# Patient Record
Sex: Male | Born: 1935 | Race: Black or African American | Hispanic: No | Marital: Married | Smoking: Former smoker
Health system: Southern US, Community
[De-identification: ages and names within clinical notes are randomized; demographics above are authoritative.]

## PROBLEM LIST (undated history)

## (undated) DIAGNOSIS — F0280 Dementia in other diseases classified elsewhere without behavioral disturbance: Secondary | ICD-10-CM

## (undated) DIAGNOSIS — J40 Bronchitis, not specified as acute or chronic: Secondary | ICD-10-CM

## (undated) DIAGNOSIS — G309 Alzheimer's disease, unspecified: Secondary | ICD-10-CM

## (undated) DIAGNOSIS — H269 Unspecified cataract: Secondary | ICD-10-CM

## (undated) HISTORY — PX: CATARACT EXTRACTION, BILATERAL: SHX1313

## (undated) HISTORY — PX: LUNG REMOVAL, PARTIAL: SHX233

---

## 1990-04-28 HISTORY — PX: PROSTATECTOMY: SHX69

## 1997-11-23 ENCOUNTER — Ambulatory Visit (HOSPITAL_COMMUNITY): Admission: RE | Admit: 1997-11-23 | Discharge: 1997-11-23 | Payer: Self-pay | Admitting: Gastroenterology

## 1998-03-01 ENCOUNTER — Inpatient Hospital Stay (HOSPITAL_COMMUNITY)
Admission: AD | Admit: 1998-03-01 | Discharge: 1998-03-03 | Payer: Self-pay | Admitting: Thoracic Surgery (Cardiothoracic Vascular Surgery)

## 1998-03-01 ENCOUNTER — Encounter: Payer: Self-pay | Admitting: Thoracic Surgery (Cardiothoracic Vascular Surgery)

## 1998-03-02 ENCOUNTER — Encounter: Payer: Self-pay | Admitting: Thoracic Surgery (Cardiothoracic Vascular Surgery)

## 1998-03-03 ENCOUNTER — Encounter: Payer: Self-pay | Admitting: Thoracic Surgery (Cardiothoracic Vascular Surgery)

## 1998-03-26 ENCOUNTER — Encounter: Payer: Self-pay | Admitting: Thoracic Surgery (Cardiothoracic Vascular Surgery)

## 1998-03-27 ENCOUNTER — Inpatient Hospital Stay (HOSPITAL_COMMUNITY)
Admission: RE | Admit: 1998-03-27 | Discharge: 1998-04-04 | Payer: Self-pay | Admitting: Thoracic Surgery (Cardiothoracic Vascular Surgery)

## 1998-03-27 ENCOUNTER — Encounter: Payer: Self-pay | Admitting: Thoracic Surgery (Cardiothoracic Vascular Surgery)

## 1998-03-28 ENCOUNTER — Encounter: Payer: Self-pay | Admitting: Thoracic Surgery (Cardiothoracic Vascular Surgery)

## 1998-03-29 ENCOUNTER — Encounter: Payer: Self-pay | Admitting: Thoracic Surgery (Cardiothoracic Vascular Surgery)

## 1998-03-30 ENCOUNTER — Encounter: Payer: Self-pay | Admitting: Thoracic Surgery (Cardiothoracic Vascular Surgery)

## 1998-03-31 ENCOUNTER — Encounter: Payer: Self-pay | Admitting: Thoracic Surgery (Cardiothoracic Vascular Surgery)

## 1998-04-01 ENCOUNTER — Encounter: Payer: Self-pay | Admitting: Thoracic Surgery (Cardiothoracic Vascular Surgery)

## 1999-04-03 ENCOUNTER — Ambulatory Visit (HOSPITAL_COMMUNITY): Admission: RE | Admit: 1999-04-03 | Discharge: 1999-04-03 | Payer: Self-pay | Admitting: Gastroenterology

## 1999-05-30 ENCOUNTER — Encounter: Payer: Self-pay | Admitting: Emergency Medicine

## 1999-05-30 ENCOUNTER — Encounter: Admission: RE | Admit: 1999-05-30 | Discharge: 1999-05-30 | Payer: Self-pay | Admitting: Emergency Medicine

## 1999-10-08 ENCOUNTER — Encounter: Payer: Self-pay | Admitting: Emergency Medicine

## 1999-10-08 ENCOUNTER — Encounter: Admission: RE | Admit: 1999-10-08 | Discharge: 1999-10-08 | Payer: Self-pay | Admitting: Emergency Medicine

## 2001-10-14 ENCOUNTER — Encounter: Payer: Self-pay | Admitting: Emergency Medicine

## 2001-10-14 ENCOUNTER — Emergency Department (HOSPITAL_COMMUNITY): Admission: EM | Admit: 2001-10-14 | Discharge: 2001-10-14 | Payer: Self-pay | Admitting: Emergency Medicine

## 2002-11-16 ENCOUNTER — Encounter: Admission: RE | Admit: 2002-11-16 | Discharge: 2002-11-16 | Payer: Self-pay | Admitting: Family Medicine

## 2002-11-16 ENCOUNTER — Encounter: Payer: Self-pay | Admitting: Family Medicine

## 2004-02-07 ENCOUNTER — Encounter: Admission: RE | Admit: 2004-02-07 | Discharge: 2004-02-07 | Payer: Self-pay | Admitting: Family Medicine

## 2004-07-08 ENCOUNTER — Ambulatory Visit: Payer: Self-pay | Admitting: Family Medicine

## 2004-07-22 ENCOUNTER — Ambulatory Visit: Payer: Self-pay

## 2006-12-14 DIAGNOSIS — J449 Chronic obstructive pulmonary disease, unspecified: Secondary | ICD-10-CM

## 2006-12-14 DIAGNOSIS — J4489 Other specified chronic obstructive pulmonary disease: Secondary | ICD-10-CM | POA: Insufficient documentation

## 2007-08-17 ENCOUNTER — Encounter: Admission: RE | Admit: 2007-08-17 | Discharge: 2007-08-17 | Payer: Self-pay | Admitting: Emergency Medicine

## 2008-07-14 ENCOUNTER — Emergency Department (HOSPITAL_COMMUNITY): Admission: EM | Admit: 2008-07-14 | Discharge: 2008-07-14 | Payer: Self-pay | Admitting: Family Medicine

## 2008-07-30 ENCOUNTER — Emergency Department (HOSPITAL_COMMUNITY): Admission: EM | Admit: 2008-07-30 | Discharge: 2008-07-30 | Payer: Self-pay | Admitting: Family Medicine

## 2009-11-13 ENCOUNTER — Emergency Department (HOSPITAL_COMMUNITY): Admission: EM | Admit: 2009-11-13 | Discharge: 2009-11-13 | Payer: Self-pay | Admitting: Family Medicine

## 2010-02-15 ENCOUNTER — Emergency Department (HOSPITAL_COMMUNITY): Admission: EM | Admit: 2010-02-15 | Discharge: 2010-02-15 | Payer: Self-pay | Admitting: Emergency Medicine

## 2010-02-15 ENCOUNTER — Emergency Department (HOSPITAL_COMMUNITY): Admission: EM | Admit: 2010-02-15 | Discharge: 2010-02-15 | Payer: Self-pay | Admitting: Family Medicine

## 2010-05-19 ENCOUNTER — Encounter: Payer: Self-pay | Admitting: Family Medicine

## 2010-05-30 ENCOUNTER — Inpatient Hospital Stay (INDEPENDENT_AMBULATORY_CARE_PROVIDER_SITE_OTHER)
Admission: RE | Admit: 2010-05-30 | Discharge: 2010-05-30 | Disposition: A | Discharge status: Home / Self Care | Payer: Federal, State, Local not specified - PPO | Source: Ambulatory Visit | Attending: Family Medicine | Admitting: Family Medicine

## 2010-05-30 DIAGNOSIS — K115 Sialolithiasis: Secondary | ICD-10-CM

## 2010-06-01 ENCOUNTER — Inpatient Hospital Stay (INDEPENDENT_AMBULATORY_CARE_PROVIDER_SITE_OTHER)
Admission: RE | Admit: 2010-06-01 | Discharge: 2010-06-01 | Disposition: A | Discharge status: Home / Self Care | Payer: Federal, State, Local not specified - PPO | Source: Ambulatory Visit

## 2010-06-01 DIAGNOSIS — K115 Sialolithiasis: Secondary | ICD-10-CM

## 2010-07-10 LAB — BASIC METABOLIC PANEL
BUN: 15 mg/dL (ref 6–23)
Calcium: 8.7 mg/dL (ref 8.4–10.5)
Creatinine, Ser: 0.86 mg/dL (ref 0.4–1.5)
GFR calc non Af Amer: >60 mL/min (ref >60)
Potassium: 4.2 mEq/L (ref 3.5–5.1)
Sodium: 137 mEq/L (ref 135–145)

## 2010-09-13 NOTE — Procedures (Signed)
. [facility]  Patient:    Ivan Young                         MRN: [id number] Proc. Date: 04/03/99 Adm. Date:  [id number] Attending:  Charna Elizabeth CC:         Reuben Likes, M.D.                           Procedure Report  DATE OF BIRTH:  05-May-1916  REFERRING PHYSICIAN:  Reuben Likes, M.D.  PROCEDURE PERFORMED:  Colonoscopy.  ENDOSCOPIST: Anselmo Rod, M.D.  INSTRUMENT USED:  Olympus video colonoscope.  INDICATIONS FOR PROCEDURE:  Abnormal barium enema with focal narrowing seen at he junction of the splenic flexure and transverse colon, rule out masses, etc.  PREPROCEDURE PREPARATION:  Informed consent was procured from the patient.  The  patient was fasted for eight hours prior to the procedure and prepped with a bottle of magnesium citrate and a gallon of NuLytely the night prior to the procedure.  PREPROCEDURE PHYSICAL:  The patient had stable vital signs.  Neck supple. Chest clear to auscultation.  S1, S2 regular.  Abdomen soft with normal abdominal bowel sounds.  DESCRIPTION OF PROCEDURE:  The patient was placed in the left lateral decubitus  position and sedated with 30 mg Demerol and 1.5 mg of Versed intravenously. Once the patient was adequately sedated and maintained on low-flow oxygen and continuous cardiac monitoring, the Olympus video colonoscope was advanced from the rectum o the cecum with extreme difficulty secondary to a large amount of residual stool in the colon; however, no masses, narrowing, polyps, etc were noticed.  The prep was poor and therefore, very small lesions may have been missed.  Patient had small  internal hemorrhoids seen on retroflexion and tolerated procedure well without complication.  IMPRESSION: 1. No abnormality seen at the junction of the splenic flexure and the transverse    colon. 2. Small nonbleeding internal hemorrhoid. 3. Very poor prep.  Very small lesions may  have been missed.  RECOMMENDATIONS: 1. Patient has been advised to follow up in the office. 2. Repeat flexible sigmoidoscopy is recommended in the next five years as there    is no history of risk factors in this patient with regard to colorectal cancer. DD:  04/03/99 TD:  04/04/99 Job: [id number]

## 2011-01-06 ENCOUNTER — Other Ambulatory Visit: Payer: Self-pay | Admitting: Family Medicine

## 2011-01-07 ENCOUNTER — Ambulatory Visit
Admission: RE | Admit: 2011-01-07 | Discharge: 2011-01-07 | Disposition: A | Discharge status: Home / Self Care | Payer: Federal, State, Local not specified - PPO | Source: Ambulatory Visit | Attending: Family Medicine | Admitting: Family Medicine

## 2011-03-05 ENCOUNTER — Inpatient Hospital Stay (HOSPITAL_COMMUNITY)
Admission: EM | Admit: 2011-03-05 | Discharge: 2011-03-09 | DRG: 389 | Disposition: A | Discharge status: Home Health | Payer: Medicare Other | Attending: Internal Medicine | Admitting: Internal Medicine

## 2011-03-05 ENCOUNTER — Emergency Department (HOSPITAL_COMMUNITY): Payer: Medicare Other

## 2011-03-05 ENCOUNTER — Other Ambulatory Visit: Payer: Self-pay

## 2011-03-05 DIAGNOSIS — Z902 Acquired absence of lung [part of]: Secondary | ICD-10-CM

## 2011-03-05 DIAGNOSIS — E860 Dehydration: Secondary | ICD-10-CM

## 2011-03-05 DIAGNOSIS — K56609 Unspecified intestinal obstruction, unspecified as to partial versus complete obstruction: Secondary | ICD-10-CM

## 2011-03-05 DIAGNOSIS — F0280 Dementia in other diseases classified elsewhere without behavioral disturbance: Secondary | ICD-10-CM | POA: Diagnosis present

## 2011-03-05 DIAGNOSIS — R112 Nausea with vomiting, unspecified: Secondary | ICD-10-CM | POA: Diagnosis present

## 2011-03-05 DIAGNOSIS — K5900 Constipation, unspecified: Secondary | ICD-10-CM | POA: Diagnosis present

## 2011-03-05 DIAGNOSIS — E873 Alkalosis: Secondary | ICD-10-CM

## 2011-03-05 DIAGNOSIS — N19 Unspecified kidney failure: Secondary | ICD-10-CM

## 2011-03-05 DIAGNOSIS — J449 Chronic obstructive pulmonary disease, unspecified: Secondary | ICD-10-CM

## 2011-03-05 DIAGNOSIS — G309 Alzheimer's disease, unspecified: Secondary | ICD-10-CM | POA: Diagnosis present

## 2011-03-05 DIAGNOSIS — E878 Other disorders of electrolyte and fluid balance, not elsewhere classified: Secondary | ICD-10-CM | POA: Diagnosis present

## 2011-03-05 DIAGNOSIS — E46 Unspecified protein-calorie malnutrition: Secondary | ICD-10-CM | POA: Diagnosis present

## 2011-03-05 DIAGNOSIS — J4489 Other specified chronic obstructive pulmonary disease: Secondary | ICD-10-CM

## 2011-03-05 DIAGNOSIS — N289 Disorder of kidney and ureter, unspecified: Secondary | ICD-10-CM

## 2011-03-05 DIAGNOSIS — K5669 Other intestinal obstruction: Principal | ICD-10-CM | POA: Diagnosis present

## 2011-03-05 DIAGNOSIS — R109 Unspecified abdominal pain: Secondary | ICD-10-CM

## 2011-03-05 HISTORY — DX: Alzheimer's disease, unspecified: G30.9

## 2011-03-05 HISTORY — DX: Alzheimer's disease, unspecified: F02.80

## 2011-03-05 HISTORY — DX: Bronchitis, not specified as acute or chronic: J40

## 2011-03-05 HISTORY — DX: Unspecified cataract: H26.9

## 2011-03-05 LAB — CBC
HCT: 39.9 % (ref 39.0–52.0)
HCT: 37.5 % — ABNORMAL LOW (ref 39.0–52.0)
Hemoglobin: 12.4 g/dL — ABNORMAL LOW (ref 13.0–17.0)
MCH: 30 pg (ref 26.0–34.0)
MCHC: 33.1 g/dL (ref 30.0–36.0)
MCV: 90.7 fL (ref 78.0–100.0)
MCV: 90.6 fL (ref 78.0–100.0)
Platelets: 240 10*3/uL (ref 150–400)
RDW: 15.2 % (ref 11.5–15.5)
RDW: 15.1 % (ref 11.5–15.5)
WBC: 8.3 10*3/uL (ref 4.0–10.5)

## 2011-03-05 LAB — URINE MICROSCOPIC-ADD ON

## 2011-03-05 LAB — COMPREHENSIVE METABOLIC PANEL
ALT: 9 U/L (ref 0–53)
Albumin: 3 g/dL — ABNORMAL LOW (ref 3.5–5.2)
Alkaline Phosphatase: 82 U/L (ref 39–117)
Alkaline Phosphatase: 75 U/L (ref 39–117)
BUN: 79 mg/dL — ABNORMAL HIGH (ref 6–23)
CO2: 36 mEq/L — ABNORMAL HIGH (ref 19–32)
CO2: 35 mEq/L — ABNORMAL HIGH (ref 19–32)
Chloride: 94 mEq/L — ABNORMAL LOW (ref 96–112)
Creatinine, Ser: 3.32 mg/dL — ABNORMAL HIGH (ref 0.50–1.35)
GFR calc Af Amer: 15 mL/min — ABNORMAL LOW (ref >90)
GFR calc Af Amer: 17 mL/min — ABNORMAL LOW (ref >90)
GFR calc non Af Amer: 13 mL/min — ABNORMAL LOW (ref >90)
GFR calc non Af Amer: 15 mL/min — ABNORMAL LOW (ref >90)
Glucose, Bld: 114 mg/dL — ABNORMAL HIGH (ref 70–99)
Glucose, Bld: 93 mg/dL (ref 70–99)
Potassium: 4 mEq/L (ref 3.5–5.1)
Potassium: 3.6 mEq/L (ref 3.5–5.1)
Sodium: 141 mEq/L (ref 135–145)
Total Bilirubin: 0.3 mg/dL (ref 0.3–1.2)
Total Protein: 8 g/dL (ref 6.0–8.3)

## 2011-03-05 LAB — URINALYSIS, ROUTINE W REFLEX MICROSCOPIC
Nitrite: NEGATIVE
pH: 5.5 (ref 5.0–8.0)

## 2011-03-05 LAB — POCT I-STAT TROPONIN I

## 2011-03-05 LAB — MAGNESIUM: Magnesium: 2.7 mg/dL — ABNORMAL HIGH (ref 1.5–2.5)

## 2011-03-05 MED ORDER — DEXTROSE-NACL 5-0.45 % IV SOLN
INTRAVENOUS | Status: DC
  Administered 2011-03-05 – 2011-03-06 (×2): via INTRAVENOUS

## 2011-03-05 MED ORDER — DEXTROSE 5 % IV SOLN
500.0000 mg | Freq: Once | INTRAVENOUS | Status: AC
  Administered 2011-03-05: 500 mg via INTRAVENOUS
  Filled 2011-03-05 (×2): qty 500

## 2011-03-05 MED ORDER — HEPARIN SODIUM (PORCINE) 5000 UNIT/ML IJ SOLN
5000.0000 [IU] | Freq: Three times a day (TID) | INTRAMUSCULAR | Status: DC
  Administered 2011-03-05 – 2011-03-09 (×10): 5000 [IU] via SUBCUTANEOUS
  Filled 2011-03-05 (×15): qty 1

## 2011-03-05 MED ORDER — HEPARIN SODIUM (PORCINE) 5000 UNIT/ML IJ SOLN: 5000.0000 [IU] | Freq: Three times a day (TID) | INTRAMUSCULAR | Status: DC

## 2011-03-05 MED ORDER — SODIUM CHLORIDE 0.9 % IV BOLUS (SEPSIS)
1000.0000 mL | Freq: Once | INTRAVENOUS | Status: AC
  Administered 2011-03-05: 1000 mL via INTRAVENOUS

## 2011-03-05 MED ORDER — ONDANSETRON HCL 4 MG PO TABS: 4.0000 mg | ORAL_TABLET | Freq: Four times a day (QID) | ORAL | Status: DC | PRN

## 2011-03-05 MED ORDER — ONDANSETRON HCL 4 MG/2ML IJ SOLN
4.0000 mg | Freq: Three times a day (TID) | INTRAMUSCULAR | Status: AC | PRN
  Administered 2011-03-05: 4 mg via INTRAVENOUS
  Filled 2011-03-05 (×2): qty 2

## 2011-03-05 MED ORDER — SODIUM CHLORIDE 0.9 % IV SOLN
INTRAVENOUS | Status: AC
  Administered 2011-03-05: 11:00:00 via INTRAVENOUS

## 2011-03-05 MED ORDER — LIDOCAINE HCL 2 % EX GEL
CUTANEOUS | Status: AC
  Filled 2011-03-05: qty 10

## 2011-03-05 MED ORDER — ONDANSETRON HCL 4 MG/2ML IJ SOLN: 4.0000 mg | Freq: Four times a day (QID) | INTRAMUSCULAR | Status: DC | PRN

## 2011-03-05 MED ORDER — FLEET ENEMA 7-19 GM/118ML RE ENEM
1.0000 | ENEMA | Freq: Two times a day (BID) | RECTAL | Status: DC
  Administered 2011-03-05 – 2011-03-09 (×8): 1 via RECTAL
  Filled 2011-03-05 (×8): qty 1

## 2011-03-05 MED ORDER — DEXTROSE 5 % IV SOLN
1.0000 g | INTRAVENOUS | Status: DC
  Administered 2011-03-05: 1 g via INTRAVENOUS
  Filled 2011-03-05 (×2): qty 10

## 2011-03-05 NOTE — ED Notes (Signed)
Pt with hx of Alzheimer's disease presents with c/o flu-like symptoms and n/v x3 days. Per pts wife, he was seen a few days ago, dx with bronchitis and given and rx for Phenergan PR without relief. Pts wife wants him re-evaluated for same.

## 2011-03-05 NOTE — Consults (Signed)
Pt seen and examined.  He has no previous abdominal operations.  CT was reviewed.  I do not see an obvious transition point on my reviewing.  He has a significant colonic stool burden.  Plan per KB's note.

## 2011-03-05 NOTE — ED Provider Notes (Addendum)
NOTE- The clinical picture was not suggestive of pneumonia, which resulted in a delay of the diagnosis of pneumonia at the time of admission.  CT scan with PNA, will tx with IV abx  Small bowel obstruction. Awaiting call from general surgery. NG placed. Being admitted by hospitalist  Lyanne Co, MD 03/05/11 0933  9:48 AM Surgery to evaluate the pt in the ER. Discussed with Caryn Bee, surgical PA  Lyanne Co, MD 03/05/11 [phone]

## 2011-03-05 NOTE — ED Provider Notes (Signed)
History     CSN: [id number] Arrival date & time: 03/05/2011  3:25 AM   First MD Initiated Contact with Patient 03/05/11 0358      Chief Complaint  Patient presents with  . Nausea  . Emesis    (Consider location/radiation/quality/duration/timing/severity/associated sxs/prior treatment) Patient is a 77 y.o. male presenting with vomiting. The history is provided by the patient and the spouse.  Emesis  Pertinent negatives include no abdominal pain, no chills, no fever and no headaches.  pt c/o nausea, decreased appetite for past few days. Has become generally weak. No vomiting or diarrhea. States is moving bowels but unsure when last. No cough or uri c/o. No gu c/o. No fever or chills. Has fallen due to legs giving out/gen weakness. Denies syncope or loc. Denies injury. No headache. No chest pain. No sob. No abd pain. Symptoms constant since onset. Saw pcp w same 2 days ago, was told possible virus.   Past Medical History  Diagnosis Date  . Alzheimer disease     No past surgical history on file.  No family history on file.  History  Substance Use Topics  . Smoking status: Never Smoker   . Smokeless tobacco: Not on file  . Alcohol Use: No      Review of Systems  Constitutional: Negative for fever and chills.  HENT: Negative for neck pain.   Eyes: Negative for pain and visual disturbance.  Respiratory: Negative for shortness of breath.   Cardiovascular: Negative for chest pain and leg swelling.  Gastrointestinal: Positive for vomiting. Negative for abdominal pain.  Genitourinary: Negative for dysuria and flank pain.  Musculoskeletal: Negative for back pain.  Skin: Negative for rash.  Neurological: Negative for headaches.  Hematological: Does not bruise/bleed easily.  Psychiatric/Behavioral: Negative for agitation. The patient is not nervous/anxious.     Allergies  Review of patient's allergies indicates no known allergies.  Home Medications  No current outpatient  prescriptions on file.  BP 93/62  Pulse 113  Temp(Src) 97.5 F (36.4 C) (Oral)  Resp 19  SpO2 85%  Physical Exam  Nursing note and vitals reviewed. Constitutional: No distress.  HENT:  Head: Atraumatic.  Eyes: Pupils are equal, round, and reactive to light.  Neck: Neck supple. No tracheal deviation present.       No stiffness or rigidity  Cardiovascular: Regular rhythm and intact distal pulses.  Exam reveals friction rub. Exam reveals no gallop.   No murmur heard.      tachycardic  Pulmonary/Chest: Effort normal and breath sounds normal. No accessory muscle usage. No respiratory distress. He has no rales.  Abdominal: Soft. He exhibits distension. He exhibits no mass. There is tenderness. There is no rebound and no guarding.  Genitourinary:       No cva tenderness  Musculoskeletal: Normal range of motion. He exhibits no edema and no tenderness.  Neurological: He is alert.       Alert and awake. gen weak, moving bil ext purposefully, follows commands. Speech quiet but clear.   Skin: Skin is warm and dry. No rash noted.  Psychiatric: He has a normal mood and affect.    ED Course  Procedures (including critical care time)  Labs Reviewed - No data to display No results found. Results for orders placed during the hospital encounter of 03/05/11  CBC      Component Value Range   WBC 6.7  4.0 - 10.5 (K/uL)   RBC 4.40  4.22 - 5.81 (MIL/uL)   Hemoglobin 13.2  13.0 - 17.0 (g/dL)   HCT 40.9  81.1 - 91.4 (%)   MCV 90.7  78.0 - 100.0 (fL)   MCH 30.0  26.0 - 34.0 (pg)   MCHC 33.1  30.0 - 36.0 (g/dL)   RDW 78.2  95.6 - 21.3 (%)   Platelets 240  150 - 400 (K/uL)  COMPREHENSIVE METABOLIC PANEL      Component Value Range   Sodium 141  135 - 145 (mEq/L)   Potassium 4.0  3.5 - 5.1 (mEq/L)   Chloride 92 (*) 96 - 112 (mEq/L)   CO2 36 (*) 19 - 32 (mEq/L)   Glucose, Bld 114 (*) 70 - 99 (mg/dL)   BUN 73 (*) 6 - 23 (mg/dL)   Creatinine, Ser 0.86 (*) 0.50 - 1.35 (mg/dL)   Calcium 9.1  8.4  - 10.5 (mg/dL)   Total Protein 8.0  6.0 - 8.3 (g/dL)   Albumin 3.4 (*) 3.5 - 5.2 (g/dL)   AST 23  0 - 37 (U/L)   ALT 9  0 - 53 (U/L)   Alkaline Phosphatase 82  39 - 117 (U/L)   Total Bilirubin 0.7  0.3 - 1.2 (mg/dL)   GFR calc non Af Amer 13 (*) >90 (mL/min)   GFR calc Af Amer 15 (*) >90 (mL/min)  LIPASE, BLOOD      Component Value Range   Lipase 7 (*) 11 - 59 (U/L)  POCT I-STAT TROPONIN I      Component Value Range   Troponin i, poc 0.02  0.00 - 0.08 (ng/mL)   Comment 3            Dg Abd Acute W/chest  03/05/2011  *RADIOLOGY REPORT*  Clinical Data: Upper abdominal pain, nausea, vomiting.  ACUTE ABDOMEN SERIES (ABDOMEN 2 VIEW & CHEST 1 VIEW)  Comparison: 01/08/2004 CT  Findings: Centrolobular and subpleural emphysematous changes. 1.4 cm right upper lung nodule is similar to 2005.  Tortuous aortic arch with atherosclerotic calcification.  The cardiac contour within normal limits.  Dilated loops of small bowel, with air-fluid levels.  Moderate stool burden.  No free intraperitoneal air identified.  Diffuse osteopenia.  Calcific density projecting over the right upper quadrant may represent a gallstone or renal stone.  IMPRESSION: Dilated loops of bowel.  Small bowel obstruction not excluded. There is a moderate stool burden. Consider CT to better characterize.  Original Report Authenticated By: Waneta Martins, M.D.      No diagnosis found.    MDM  Iv ns bolus. Labs.     Date: 03/05/2011  Rate: 107  Rhythm: sinus tachycardia  QRS Axis: right  Intervals: normal  ST/T Wave abnormalities: nonspecific ST/T changes  Conduction Disutrbances:lvh  Narrative Interpretation:   Old EKG Reviewed: changes noted   Recheck abd soft, distended. No reb or guarding. No recurrent emesis in ed. Additional ivf. Pt w dehydration/renal insuff - triad called to admit. Ct r/o sbo ordered and pending. Signed out to morning md, campos, to check ct when back and consult surgery if sbo.     Suzi Roots, MD 03/05/11 0700

## 2011-03-05 NOTE — ED Notes (Signed)
Patient transported to CT 

## 2011-03-05 NOTE — ED Notes (Signed)
Pt resting in bed quietly with eyes closed. Pt's daughter is at bedside. Transport called. Pt awaiting transport to floor. Report called to [place] rn

## 2011-03-05 NOTE — ED Notes (Signed)
Family at bedside. 

## 2011-03-05 NOTE — ED Notes (Signed)
Gastric tubed placed pt tolerated brownish gastric content out. Pt continues to await admit. Pt's family is at bedside

## 2011-03-05 NOTE — H&P (Signed)
Hospital Admission Note Date: 03/05/2011  Patient name: Ivan Young Medical record number: [id number] Date of birth: 12/28/1916 Age: 77 y.o. Gender: male PCP: No primary provider on file.  Chief Complaint: Nausea and vomiting  History of Present Illness: There is a 77 year old male with past medical history of Alzheimer's was not able to give a history but his daughter is at bedside gave most of the history. He has been in usual of health living at home tolerating his normal diet until Friday. He started throwing up. When he saw his primary care doctor given some suppositories for constipation. And told him that if this does not get better he should go to ED. over the weekend he started vomiting. Was not able to tolerate anything by mouth. He started getting dizzy upon standing and actually fell but did not had his head. Chest x-ray was done here in the ED with a CT scan that showed an obstruction. We were asked to admit and further evaluate.  The patient relates no chest pain shortness of breath diarrhea. He does not know when his last bowel movement was   Allergies: Review of patient's allergies indicates no known allergies. Past Medical History  Diagnosis Date  . Alzheimer disease   . Asthma   . Bronchitis   . Cataracts, bilateral    Prior to Admission medications   Medication Sig Start Date End Date Taking? Authorizing Provider  hyoscyamine (LEVSIN, ANASPAZ) 0.125 MG tablet Take 0.125 mg by mouth 3 (three) times daily with meals. Stomach pain    Yes Historical Provider, MD  promethazine (PHENERGAN) 25 MG suppository Place 25 mg rectally every 6 (six) hours as needed. Nausea/vomiting    Yes Historical Provider, MD   Past Surgical History  Procedure Date  . Prostatectomy 1992  . Lung removal, partial   . Cataract extraction, bilateral    History reviewed. No pertinent family history. History   Social History  . Marital Status: Married    Spouse Name: N/A    Number of  Children: N/A  . Years of Education: N/A   Occupational History  . Not on file.   Social History Main Topics  . Smoking status: Former Smoker -- 0.2 packs/day  . Smokeless tobacco: Not on file  . Alcohol Use: 0.6 oz/week    1 Shots of liquor per week  . Drug Use: No  . Sexually Active: No   Other Topics Concern  . Not on file   Social History Narrative  . No narrative on file   Review of Systems: Pertinent items are noted in HPI. Physical Exam: Filed Vitals:   03/05/11 0329 03/05/11 0411 03/05/11 0644  BP: 89/63 93/62 130/66  Pulse: 119 113 105  Temp: 97.7 F (36.5 C) 97.5 F (36.4 C) 97.8 F (36.6 C)  TempSrc: Oral Oral Oral  Resp: 20 19 16   SpO2: 97% 85%     Intake/Output Summary (Last 24 hours) at 03/05/11 1655 Last data filed at 03/05/11 1301  Gross per 24 hour  Intake      0 ml  Output      0 ml  Net      0 ml   BP 130/66  Pulse 105  Temp(Src) 97.8 F (36.6 C) (Oral)  Resp 16  SpO2 85%  General Appearance:    Alert, cooperative, no distress, appears stated age  Head:    Normocephalic, without obvious abnormality, atraumatic  Eyes:    PERRL, conjunctiva/corneas clear, EOM's intact, fundi  benign, both eyes       Ears:    Normal TM's and external ear canals, both ears  Nose:   Nares normal, septum midline, mucosa normal, no drainage    or sinus tenderness  Throat:   Lips, mucosa, and tongue normal; teeth and gums normal  Neck:   Supple, symmetrical, trachea midline, no adenopathy;       thyroid:  No enlargement/tenderness/nodules; no carotid   bruit or JVD  Back:     Symmetric, no curvature, ROM normal, no CVA tenderness  Lungs:     Clear to auscultation bilaterally, respirations unlabored  Chest wall:    No tenderness or deformity  Heart:    Regular rate and rhythm, S1 and S2 normal, no murmur, rub   or gallop  Abdomen:     Soft, non-tender, bowel sounds active all four quadrants,    no masses, no organomegaly  Genitalia:   deferred   Rectal:    deferred  Extremities:   Extremities normal, atraumatic, no cyanosis or edema  Pulses:   2+ and symmetric all extremities  Skin:   Skin color, texture, turgor normal, no rashes or lesions  Lymph nodes:   Cervical, supraclavicular, and axillary nodes normal  Neurologic:   CNII-XII intact. Normal strength, sensation and reflexes      throughout   Lab results:  [facility] 03/05/11 0546  NA 141  K 4.0  CL 92*  CO2 36*  GLUCOSE 114*  BUN 73*  CREATININE 3.67*  CALCIUM 9.1  MG --  PHOS --    Basename 03/05/11 0546  AST 23  ALT 9  ALKPHOS 82  BILITOT 0.7  PROT 8.0  ALBUMIN 3.4*    Basename 03/05/11 0546  LIPASE 7*  AMYLASE --    Basename 03/05/11 0546  WBC 6.7  NEUTROABS --  HGB 13.2  HCT 39.9  MCV 90.7  PLT 240     Imaging results:  Ct Abdomen Pelvis Wo Contrast  03/05/2011  *RADIOLOGY REPORT*  Clinical Data: Pain, vomiting, elevated creatinine  CT ABDOMEN AND PELVIS WITHOUT CONTRAST  Technique:  Multidetector CT imaging of the abdomen and pelvis was performed following the standard protocol without intravenous contrast.  Comparison: None.  Findings: Coarse airspace opacities in the visualized posterior lower lobes, left worse than right.  Moderately advanced emphysematous changes are also noted.  Coronary and aortic calcifications.  There is a 3.9 cm infrarenal fusiform abdominal aortic aneurysm.  Stomach is distended.  There are multiple dilated small bowel loops throughout the abdomen with scattered fluid levels.  The distal most small bowel appears decompressed.  There is moderate fecal material throughout the nondilated colon and rectum.  Urinary bladder is decompressed.  No definite mass or abscess, sensitivity decreased secondary to paucity of intra-abdominal fat and lack of oral   or IV contrast.  Unremarkable uninfused evaluation of the liver, spleen, adrenal glands, left kidney. There is right nephrolithiasis, largest stone in the interpolar region 10 mm; no  hydronephrosis.  Pancreas is not well delineated. Extensive iliofemoral arterial calcifications.  IMPRESSION:  1.  Distal small bowel obstruction, etiology uncertain. 2.  Bibasilar pulmonary air space opacities consistent with pneumonia. 3.  3.9 cm abdominal aortic aneurysm. 4.  Right nephrolithiasis without hydronephrosis.  Original Report Authenticated By: Osa Craver, M.D.   Dg Abd Acute W/chest  03/05/2011  *RADIOLOGY REPORT*  Clinical Data: Upper abdominal pain, nausea, vomiting.  ACUTE ABDOMEN SERIES (ABDOMEN 2 VIEW & CHEST 1 VIEW)  Comparison:  01/08/2004 CT  Findings: Centrolobular and subpleural emphysematous changes. 1.4 cm right upper lung nodule is similar to 2005.  Tortuous aortic arch with atherosclerotic calcification.  The cardiac contour within normal limits.  Dilated loops of small bowel, with air-fluid levels.  Moderate stool burden.  No free intraperitoneal air identified.  Diffuse osteopenia.  Calcific density projecting over the right upper quadrant may represent a gallstone or renal stone.  IMPRESSION: Dilated loops of bowel.  Small bowel obstruction not excluded. There is a moderate stool burden. Consider CT to better characterize.  Original Report Authenticated By: Waneta Martins, M.D.   Other results: EKG: Pending at the time of this    Active Hospital Problem List: 1. Small bowel obstruction. We do not know when the patient had last bowel movement. He has not been tolerating his diet. CT scan shows some bowel obstruction with bowel full of stool. Surgery has been consulted. I agree with suppositories (soap water enemas). Try to decompress the stool. As he has large stool burden in his bowels.repeat KUB in am. Correct electrolytes and avoid narcotics.  2.Acute renal failure,  This probably secondary to decreased intake and vomiting. His last creatinine last month was 0.8.We'll check urinary sodium urinary creatinine continue IV fluids strict I.'s notes. Also check  a basic metabolic panel in the morning.  3. Hypokalemia. is probably secondary to dehydration. We'll give IV fluids and recheck a be met in the morning. Also check a magnesium level.  4. Metabolic alkalosis is probably secondary to vomiting. At this time the patient does not have a gavel continue IV fluids.  5.Protein caloric malnutrition. Will start him and ensure when he is able to take by mouth's.   Rosine Beat 03/05/2011, 4:55 PM

## 2011-03-05 NOTE — ED Notes (Signed)
ZOX:[id number]<AV> Expected date:03/05/11<BR> Expected time: 2:57 AM<BR> Means of arrival:Ambulance<BR> Comments:<BR> FLU LIKE SYMPTOMS. gc m212. elerly male eval at [facility] early in week, family req reeval for same. Vitals stable. Eta 10 min.

## 2011-03-05 NOTE — Consults (Signed)
Reason for Consult:SBO Referring Physician: Patria Mane   HPI: Ivan Young is an 77 y.o. male. He has a hx of early stage Alzheimers and was able to contribute to his history, but primarily his dtr was able to give me the story. He had been in his usual state of health, living at home, tolerating normal diet. His dtr states he has had some trouble with constipation but believes he has been moving his bowels recently. He does not routinely take laxatives or stool softeners. About 4 days ago, he developed onset on abd pain with progressive distension leading to repeated N/V. He has not had fever but has not been able to hold down even water. He has gotten progressively weaker over the past few days and his PCP recommended his famliy bring him to the ED for eval. Upon eval, he was found to be in acute renal failure and significantly distended. X-rays and CT find evidence suggestive of bowel obstruction and possible pneumonia. Medicine has been called to admit the patient for medical management and surgery has been consulted.  Past Medical History  Diagnosis Date  . Alzheimer disease   . Asthma   . Bronchitis   . Cataracts, bilateral     Past Surgical History  Procedure Date  . Prostatectomy 1992  . Lung removal, partial   . Cataract extraction, bilateral   Denies Abdominal surgery or history of hernias.  No family history on file.  Social History:  reports that he has quit smoking. He does not have any smokeless tobacco history on file. He reports that he drinks about .6 ounces of alcohol per week. He reports that he does not use illicit drugs.  Allergies: No Known Allergies  Medications: I have reviewed the patient's current medications.  ROS: See HPI for pertinent findings..  Physical Exam: Blood pressure 130/66, pulse 105, temperature 97.8 F (36.6 C), temperature source Oral, resp. rate 16, SpO2 85.00%. BP 130/66  Pulse 105  Temp(Src) 97.8 F (36.6 C) (Oral)  Resp 16  SpO2  85%  General Appearance:   Awake, slowed mentation, NAD  Head:    Normocephalic, without obvious abnormality, atraumatic  Eyes:    PERRL, conjunctiva/corneas clear, EOM's intact, fundi    benign, both eyes       Ears:    Normal TM's and external ear canals, both ears  Nose:   Nares normal, septum midline, mucosa normal, no drainage   or sinus tenderness. NG in place, about 700cc brown/green bilious output.  Throat:   Lips, mucosa, and tongue normal; teeth and gums normal  Neck:   Supple, symmetrical, trachea midline, no adenopathy;       thyroid:  No enlargement/tenderness/nodules; no carotid   bruit or JVD  Back:     Symmetric, no curvature, ROM normal, no CVA tenderness  Lungs:     Slightly diminished basilar BS, respirations unlabored  Chest wall:    No tenderness or deformity  Heart:    Regular rate and rhythm, S1 and S2 normal, no murmur, rub   or gallop  Abdomen:     Distended but soft, non-tender, no masses or hernias found. No surgical scars. BS hypoactive     Rectal:   Stool in vault  Extremities:   Extremities normal, atraumatic, no cyanosis or edema  Pulses:   Diminished but equal.  Skin:   Skin color, texture, turgor normal, no rashes or lesions  Lymph nodes:   Cervical, supraclavicular, and axillary nodes normal  Labs: CBC  Basename 03/05/11 0546  WBC 6.7  HGB 13.2  HCT 39.9  PLT 240   METABOLIC PANEL  Basename 03/05/11 0546  NA 141  K 4.0  CL 92*  CO2 36*  GLUCOSE 114*  BUN 73*  CREATININE 3.67*  CALCIUM 9.1   PT/INR No results found for this basename: LABPROT:2,INR:2 in the last 72 hours ABG No results found for this basename: PHART:2,PCO2:2,PO2:2,HCO3:2 in the last 72 hours    Ct Abdomen Pelvis Wo Contrast  03/05/2011  *RADIOLOGY REPORT*  Clinical Data: Pain, vomiting, elevated creatinine  CT ABDOMEN AND PELVIS WITHOUT CONTRAST  Technique:  Multidetector CT imaging of the abdomen and pelvis was performed following the standard protocol  without intravenous contrast.  Comparison: None.  Findings: Coarse airspace opacities in the visualized posterior lower lobes, left worse than right.  Moderately advanced emphysematous changes are also noted.  Coronary and aortic calcifications.  There is a 3.9 cm infrarenal fusiform abdominal aortic aneurysm.  Stomach is distended.  There are multiple dilated small bowel loops throughout the abdomen with scattered fluid levels.  The distal most small bowel appears decompressed.  There is moderate fecal material throughout the nondilated colon and rectum.  Urinary bladder is decompressed.  No definite mass or abscess, sensitivity decreased secondary to paucity of intra-abdominal fat and lack of oral   or IV contrast.  Unremarkable uninfused evaluation of the liver, spleen, adrenal glands, left kidney. There is right nephrolithiasis, largest stone in the interpolar region 10 mm; no hydronephrosis.  Pancreas is not well delineated. Extensive iliofemoral arterial calcifications.  IMPRESSION:  1.  Distal small bowel obstruction, etiology uncertain. 2.  Bibasilar pulmonary air space opacities consistent with pneumonia. 3.  3.9 cm abdominal aortic aneurysm. 4.  Right nephrolithiasis without hydronephrosis.  Original Report Authenticated By: Osa Craver, M.D.   Dg Abd Acute W/chest  03/05/2011  *RADIOLOGY REPORT*  Clinical Data: Upper abdominal pain, nausea, vomiting.  ACUTE ABDOMEN SERIES (ABDOMEN 2 VIEW & CHEST 1 VIEW)  Comparison: 01/08/2004 CT  Findings: Centrolobular and subpleural emphysematous changes. 1.4 cm right upper lung nodule is similar to 2005.  Tortuous aortic arch with atherosclerotic calcification.  The cardiac contour within normal limits.  Dilated loops of small bowel, with air-fluid levels.  Moderate stool burden.  No free intraperitoneal air identified.  Diffuse osteopenia.  Calcific density projecting over the right upper quadrant may represent a gallstone or renal stone.  IMPRESSION:  Dilated loops of bowel.  Small bowel obstruction not excluded. There is a moderate stool burden. Consider CT to better characterize.  Original Report Authenticated By: Waneta Martins, M.D.    Assessment: 1.Abd pain and distention-review of the CT reveals this may be more SB ileus vs. SBO secondary to significant stool burden/constipation as his entire colon is filled with stool. No prior hx of abd surgery. No masses or hernias. 2. ARF secondary to dehydration 3.Dementia 4.Possible pneumonia, lower airspace disease.  Plan: Agree with NGT to LIWS. IVF hydration. Abx per primary team for ?PNA. Recommend enemas and/or suppositories to help evacuate stool burden. Follow up x-rays for progress. We will follow. Marianna Fuss 03/05/2011, 12:30 PM

## 2011-03-05 NOTE — ED Notes (Signed)
Pt from home with hx of Alzheimer's. Pt presents with c/o n/v and flu-like symptoms x3 days. Pt was seen here Monday, was prescribed Phenergan PR without relief of sx.

## 2011-03-05 NOTE — ED Notes (Signed)
Pt resting in bed quietly awaiting ct results and admit. Pt family is at bedside

## 2011-03-05 NOTE — ED Notes (Signed)
Pt returned from ct

## 2011-03-05 NOTE — Progress Notes (Signed)
Nursing- pt tol soap suds enema unable to hold all of enema, but held most of it expelled light brown liquid after enema given Gillian Shields Ward

## 2011-03-06 ENCOUNTER — Inpatient Hospital Stay (HOSPITAL_COMMUNITY): Payer: Medicare Other

## 2011-03-06 ENCOUNTER — Other Ambulatory Visit (HOSPITAL_COMMUNITY): Payer: Federal, State, Local not specified - PPO

## 2011-03-06 MED ORDER — SODIUM CHLORIDE 0.9 % IV SOLN
INTRAVENOUS | Status: AC
  Administered 2011-03-06: 14:00:00 via INTRAVENOUS

## 2011-03-06 MED ORDER — POLYETHYLENE GLYCOL 3350 17 G PO PACK
17.0000 g | PACK | Freq: Every day | ORAL | Status: DC
  Administered 2011-03-06 – 2011-03-09 (×4): 17 g via ORAL
  Filled 2011-03-06 (×5): qty 1

## 2011-03-06 NOTE — Progress Notes (Signed)
Subjective: Patient had a large bar movement today. He relates has been having no further nausea Objective: Filed Vitals:   03/05/11 1724 03/05/11 1725 03/05/11 2209 03/06/11 0601  BP: 106/71 106/71 123/69 94/59  Pulse: 92 92 110 101  Temp: 97.9 F (36.6 C) 97.9 F (36.6 C) 100.5 F (38.1 C) 98.9 F (37.2 C)  TempSrc: Oral Oral Oral Oral  Resp: 19 19 18 17   Height: 6\' 1"  (1.854 m) 6\' 1"  (1.854 m)    Weight:  54.432 kg (120 lb)    SpO2: 88% 88% 93% 92%   Weight change:   Intake/Output Summary (Last 24 hours) at 03/06/11 1331 Last data filed at 03/06/11 0300  Gross per 24 hour  Intake    600 ml  Output    400 ml  Net    200 ml    General: Alert, awake, oriented x3, in no acute distress.  HEENT: No bruits, no goiter.  Heart: Regular rate and rhythm, without murmurs, rubs, gallops.  Lungs: Crackles left side, bilateral air movement.  Abdomen: Soft, nontender, nondistended, positive bowel sounds.  Neuro: Grossly intact, nonfocal.   Lab Results:  [facility] 03/05/11 2103 03/05/11 0546  NA 141 141  K 3.6 4.0  CL 94* 92*  CO2 35* 36*  GLUCOSE 93 114*  BUN 79* 73*  CREATININE 3.32* 3.67*  CALCIUM 8.4 9.1  MG -- 2.7*  PHOS -- --    [facility] 03/05/11 2103 03/05/11 0546  AST 29 23  ALT 10 9  ALKPHOS 75 82  BILITOT 0.3 0.7  PROT 7.1 8.0  ALBUMIN 3.0* 3.4*    Basename 03/05/11 0546  LIPASE 7*  AMYLASE --    Basename 03/05/11 2103 03/05/11 0546  WBC 8.3 6.7  NEUTROABS -- --  HGB 12.4* 13.2  HCT 37.5* 39.9  MCV 90.6 90.7  PLT 203 240           Micro Results: No results found for this or any previous visit (from the past 240 hour(s)).  Studies/Results: Ct Abdomen Pelvis Wo Contrast  03/05/2011  *RADIOLOGY REPORT*  Clinical Data: Pain, vomiting, elevated creatinine  CT ABDOMEN AND PELVIS WITHOUT CONTRAST  Technique:  Multidetector CT imaging of the abdomen and pelvis was performed following the standard protocol without intravenous contrast.   Comparison: None.  Findings: Coarse airspace opacities in the visualized posterior lower lobes, left worse than right.  Moderately advanced emphysematous changes are also noted.  Coronary and aortic calcifications.  There is a 3.9 cm infrarenal fusiform abdominal aortic aneurysm.  Stomach is distended.  There are multiple dilated small bowel loops throughout the abdomen with scattered fluid levels.  The distal most small bowel appears decompressed.  There is moderate fecal material throughout the nondilated colon and rectum.  Urinary bladder is decompressed.  No definite mass or abscess, sensitivity decreased secondary to paucity of intra-abdominal fat and lack of oral   or IV contrast.  Unremarkable uninfused evaluation of the liver, spleen, adrenal glands, left kidney. There is right nephrolithiasis, largest stone in the interpolar region 10 mm; no hydronephrosis.  Pancreas is not well delineated. Extensive iliofemoral arterial calcifications.  IMPRESSION:  1.  Distal small bowel obstruction, etiology uncertain. 2.  Bibasilar pulmonary air space opacities consistent with pneumonia. 3.  3.9 cm abdominal aortic aneurysm. 4.  Right nephrolithiasis without hydronephrosis.  Original Report Authenticated By: Osa Craver, M.D.   Dg Abd 2 Views  03/06/2011  *RADIOLOGY REPORT*  Clinical Data: Small bowel obstruction, stool burden  ABDOMEN - 2 VIEW  Comparison: 03/05/2011  Findings: Increase stool in colon. Persistent dilatation of small bowel loops compatible with small bowel obstruction. No definite bowel wall thickening or free intraperitoneal air. Small bowel gas present in the rectum. Scattered atherosclerotic calcifications. Two right renal calculi, largest 8 mm diameter.  IMPRESSION: Persistent dilatation of small bowel loops compatible with small bowel obstruction. Largest loops have slightly increased in size since previous exam. Significant retained stool burden.  Original Report Authenticated By:  Lollie Marrow, M.D.   Dg Abd Acute W/chest  03/05/2011  *RADIOLOGY REPORT*  Clinical Data: Upper abdominal pain, nausea, vomiting.  ACUTE ABDOMEN SERIES (ABDOMEN 2 VIEW & CHEST 1 VIEW)  Comparison: 01/08/2004 CT  Findings: Centrolobular and subpleural emphysematous changes. 1.4 cm right upper lung nodule is similar to 2005.  Tortuous aortic arch with atherosclerotic calcification.  The cardiac contour within normal limits.  Dilated loops of small bowel, with air-fluid levels.  Moderate stool burden.  No free intraperitoneal air identified.  Diffuse osteopenia.  Calcific density projecting over the right upper quadrant may represent a gallstone or renal stone.  IMPRESSION: Dilated loops of bowel.  Small bowel obstruction not excluded. There is a moderate stool burden. Consider CT to better characterize.  Original Report Authenticated By: Waneta Martins, M.D.    Medications: I have reviewed the patient's current medications.   Patient Active Hospital Problem List: 1.Small bowel obstruction (03/05/2011) Agree with surgery to continue the patient n.p.o. will give we'll repeat enema start MiraLAX repeat Abdominal  x-ray in the morning.  2.Renal failure (03/05/2011)  baseline creatinine last year was 0.8. We'll do strict I.'s and O.'s continue IV fluids and check a b-met in the morning.  3.Hypochloremia (03/05/2011)  slightly improved with IV fluids as well as creatinine. We'll change his IV fluids to normal saline. And recheck a basic metabolic panel in the morning.  4.Metabolic alkalosis (03/05/2011)  no diarrhea, will continue IV fluids.  5.Protein calorie malnutrition (03/05/2011)  nutrition consult,and ensure 3 times a day.   LOS: 1 day   FELIZ ORTIZ, Nykerria Macconnell 03/06/2011, 1:31 PM

## 2011-03-06 NOTE — Progress Notes (Signed)
Dr Kaylyn Layer made aware that pt pulled out NG Tube. Orders to leave NG Tube out for now.  Will continue to monitor pt. Ivan Young

## 2011-03-06 NOTE — Progress Notes (Signed)
  Subjective: Pt ok. Good large BM after enemas last pm. Pt pulled NG out sometime last pm, but no nausea or vomiting since. Denies pain.  Objective: Vital signs in last 24 hours: Temp:  [97.9 F (36.6 C)-100.5 F (38.1 C)] 98.9 F (37.2 C) (11/08 0601) Pulse Rate:  [63-110] 101  (11/08 0601) Resp:  [17-19] 17  (11/08 0601) BP: (94-127)/(47-71) 94/59 mmHg (11/08 0601) SpO2:  [88 %-93 %] 92 % (11/08 0601) Weight:  [120 lb (54.432 kg)] 120 lb (54.432 kg) (11/07 1725) Last BM Date: 02/28/11  Intake/Output this shift:    Physical Exam: Abdomen: Softer, less distended. Few BS. No masses, NT  Labs: CBC  Basename 03/05/11 2103 03/05/11 0546  WBC 8.3 6.7  HGB 12.4* 13.2  HCT 37.5* 39.9  PLT 203 240   BMET  Basename 03/05/11 2103 03/05/11 0546  NA 141 141  K 3.6 4.0  CL 94* 92*  CO2 35* 36*  GLUCOSE 93 114*  BUN 79* 73*  CREATININE 3.32* 3.67*  CALCIUM 8.4 9.1   PT/INR No results found for this basename: LABPROT:2,INR:2 in the last 72 hours ABG No results found for this basename: PHART:2,PCO2:2,PO2:2,HCO3:2 in the last 72 hours  Studies/Results: Abd x-ray just completed, no report, but looks like contrast in colon, along with continued stool burden. SB loops still dilated.   Assessment: Principal Problem:  *Small bowel obstruction Active Problems:  Renal failure  Hypochloremia  Metabolic alkalosis  Protein calorie malnutrition  Plan: Would leave NG out, give another enema.  Encourage activity if tolerated/able. If continues to improve, could start some Miralax po later today v. Tomorrow.  LOS: 1 day    Marianna Fuss 03/06/2011

## 2011-03-06 NOTE — Progress Notes (Signed)
CSW met with pt and pt's wife RE: SNF placement. Pt and pt's wife agreeable to STR SNF as b/u plan to home with [facility]. Pt's wife reports she can manage pt at home if he is able to ambulate. CSW to f/u with offers if SNF needed.

## 2011-03-06 NOTE — Progress Notes (Signed)
TALKED TO PATIENT WITH SPOUSE PRESENT ABOUT DCP; PRIMARY CAREGIVER IS THE SPOUSE, PATIENT WITH SOME CONFUSION; SPOUSE IS AGREEABLE TO SHORT TERM SNF PLACEMENT; VM LEFT WITH SOC. WORKER FOR PLACEMENT; B.Kingstin Heims RN, BSN, MHA.

## 2011-03-06 NOTE — Progress Notes (Signed)
Clinically improving.  No indication for surgery at this time.

## 2011-03-06 NOTE — Plan of Care (Signed)
Problem: Phase I Progression Outcomes Goal: Other Phase I Outcomes/Goals Outcome: Progressing Has had large bm after fleets

## 2011-03-06 NOTE — Progress Notes (Signed)
INITIAL ADULT NUTRITION ASSESSMENT Date: 03/06/2011   Time: 3:46 PM Reason for Assessment: MD consult  ASSESSMENT: Male 77 y.o.  Dx: Small bowel obstruction  Hx:  Past Medical History  Diagnosis Date  . Alzheimer disease   . Asthma   . Bronchitis   . Cataracts, bilateral    Related Meds:  Scheduled Meds:   . sodium chloride   Intravenous STAT  . heparin subcutaneous  5,000 Units Subcutaneous Q8H  . lidocaine      . polyethylene glycol  17 g Oral Daily  . sodium phosphate  1 enema Rectal BID  . DISCONTD: cefTRIAXone (ROCEPHIN) IV  1 g Intravenous Q24H  . DISCONTD: heparin  5,000 Units Subcutaneous Q8H   Continuous Infusions:   . sodium chloride    . DISCONTD: dextrose 5 % and 0.45% NaCl 75 mL/hr at 03/06/11 1019   PRN Meds:.ondansetron (ZOFRAN) IV, ondansetron (ZOFRAN) IV, ondansetron  Ht: 6' (182.9 cm)  Wt: 114 lb 3.2 oz (51.801 kg)  Ideal Wt: 80.9kg % Ideal Wt: 64  Usual Wt: 55.9kg % Usual Wt: 93  Body mass index is 15.49 kg/(m^2).  Food/Nutrition Related Hx: Wife reports pt not eating since this past Sunday r/t vomiting and reports pt has lost 9 pounds in the past 6 weeks unintentionally, but some of this may be fluid related. Wife reports pt eating well prior to Sunday, typically eating 2 meals/day r/t having late breakfast and eating early dinner. Wife reports having no idea why pt is so thin because he always eats well and has a great appetite. Wife reports pt has been gradually losing weight as he has gotten older despite eating well. Wife reports pt using Ensure on and off at home. Wife reports pt does not have any problems chewing or swallowing.   Labs:  CMP     Component Value Date/Time   NA 141 03/05/2011 2103   K 3.6 03/05/2011 2103   CL 94* 03/05/2011 2103   CO2 35* 03/05/2011 2103   GLUCOSE 93 03/05/2011 2103   BUN 79* 03/05/2011 2103   CREATININE 3.32* 03/05/2011 2103   CALCIUM 8.4 03/05/2011 2103   PROT 7.1 03/05/2011 2103   ALBUMIN 3.0* 03/05/2011  2103   AST 29 03/05/2011 2103   ALT 10 03/05/2011 2103   ALKPHOS 75 03/05/2011 2103   BILITOT 0.3 03/05/2011 2103   GFRNONAA 15* 03/05/2011 2103   GFRAA 17* 03/05/2011 2103    Intake/Output Summary (Last 24 hours) at 03/06/11 1551 Last data filed at 03/06/11 0300  Gross per 24 hour  Intake    600 ml  Output    400 ml  Net    200 ml    Diet Order: NPO  IVF:    sodium chloride   DISCONTD: dextrose 5 % and 0.45% NaCl Last Rate: 75 mL/hr at 03/06/11 1019    Estimated Nutritional Needs:   Kcal:2100-2350 Protein:95-105g Fluid:2.1-2.3L  NUTRITION DIAGNOSIS: -Inadequate oral intake (NI-2.1).  Status: Ongoing -Pt meets criteria for severe chronic PCM likely r/t advanced age/Alzheimer disease AEB severe muscle and fat loss and BMI of 15.5, suboptimal for age and build.   RELATED TO: small bowel obstruction  AS EVIDENCE BY: CT scan of abdomen/pelvis 11/7 showing distal small bowel obstruction and NPO status  MONITORING/EVALUATION(Goals): Advance diet as tolerated to regular diet.   EDUCATION NEEDS: -No education needs identified at this time  INTERVENTION: Diet advancement per MD. Will monitor.   Dietitian # [id number]  DOCUMENTATION CODES Per approved criteria  -  Severe malnutrition in the context of chronic illness    Marshall Cork 03/06/2011, 3:46 PM

## 2011-03-07 ENCOUNTER — Inpatient Hospital Stay (HOSPITAL_COMMUNITY): Payer: Medicare Other

## 2011-03-07 LAB — BASIC METABOLIC PANEL
BUN: 84 mg/dL — ABNORMAL HIGH (ref 6–23)
CO2: 35 mEq/L — ABNORMAL HIGH (ref 19–32)
Calcium: 8.8 mg/dL (ref 8.4–10.5)
Chloride: 97 mEq/L (ref 96–112)
Creatinine, Ser: 1.81 mg/dL — ABNORMAL HIGH (ref 0.50–1.35)
GFR calc Af Amer: 36 mL/min — ABNORMAL LOW (ref >90)
GFR calc non Af Amer: 31 mL/min — ABNORMAL LOW (ref >90)
Glucose, Bld: 97 mg/dL (ref 70–99)
Potassium: 3.3 mEq/L — ABNORMAL LOW (ref 3.5–5.1)
Sodium: 144 mEq/L (ref 135–145)

## 2011-03-07 NOTE — Progress Notes (Signed)
Subjective: Patient had a large bar movement today. He relates has been having no further nausea. Pt with more strength today. But relates his abdomen slightly distended today. His nontender has had multiple bowel movements Objective: Filed Vitals:   03/06/11 0601 03/06/11 1434 03/06/11 2150 03/07/11 0610  BP: 94/59 97/62 102/65 111/63  Pulse: 101 106 98 96  Temp: 98.9 F (37.2 C) 98.2 F (36.8 C) 98.2 F (36.8 C) 98.3 F (36.8 C)  TempSrc: Oral Oral Oral Oral  Resp: 17 18 18 17   Height:  6' (1.829 m)    Weight:  51.801 kg (114 lb 3.2 oz)    SpO2: 92% 87% 93% 97%   Weight change: -2.631 kg (-5 lb 12.8 oz)  Intake/Output Summary (Last 24 hours) at 03/07/11 0828 Last data filed at 03/07/11 0600  Gross per 24 hour  Intake 1818.75 ml  Output      0 ml  Net 1818.75 ml    General: Alert, awake, oriented x2, in no acute distress.  HEENT: No bruits, no goiter.  Heart: Regular rate and rhythm, without murmurs, rubs, gallops.  Lungs: Crackles left side, bilateral air movement.  Abdomen: Soft, significantly distended, not tender to palpation. And positive bowel sounds. Neuro: Grossly intact, nonfocal.   Lab Results:  [facility] 03/05/11 2103 03/05/11 0546  NA 141 141  K 3.6 4.0  CL 94* 92*  CO2 35* 36*  GLUCOSE 93 114*  BUN 79* 73*  CREATININE 3.32* 3.67*  CALCIUM 8.4 9.1  MG -- 2.7*  PHOS -- --    [facility] 03/05/11 2103 03/05/11 0546  AST 29 23  ALT 10 9  ALKPHOS 75 82  BILITOT 0.3 0.7  PROT 7.1 8.0  ALBUMIN 3.0* 3.4*    Basename 03/05/11 0546  LIPASE 7*  AMYLASE --    Basename 03/05/11 2103 03/05/11 0546  WBC 8.3 6.7  NEUTROABS -- --  HGB 12.4* 13.2  HCT 37.5* 39.9  MCV 90.6 90.7  PLT 203 240    Micro Results: No results found for this or any previous visit (from the past 240 hour(s)).  Studies/Results: Ct Abdomen Pelvis Wo Contrast  03/05/2011  *RADIOLOGY REPORT*  Clinical Data: Pain, vomiting, elevated creatinine  CT ABDOMEN AND PELVIS WITHOUT  CONTRAST IMPRESSION:  1.  Distal small bowel obstruction, etiology uncertain. 2.  Bibasilar pulmonary air space opacities consistent with pneumonia. 3.  3.9 cm abdominal aortic aneurysm. 4.  Right nephrolithiasis without hydronephrosis.  Original Report Authenticated By: Osa Craver, M.D.   Dg Abd 2 Views  03/06/2011  *RADIOLOGY REPORT*  Clinical Data: Small bowel obstruction, stool burden  ABDOMEN - 2 VIEW  Comparison: 03/05/2011  IMPRESSION: Persistent dilatation of small bowel loops compatible with small bowel obstruction. Largest loops have slightly increased in size since previous exam. Significant retained stool burden.  Original Report Authenticated By: Lollie Marrow, M.D.    Medications: I have reviewed the patient's current medications.   Patient Active Hospital Problem List: 1.Small bowel obstruction (03/05/2011)  abdominal x-ray pending this morning. The patient has significant distended abdomen today. Has positive bowel sounds. Has no complaints. Has been tolerating his MiraLAX. And multiple bowel movements yesterday. We'll wait for abdominal x-ray. We'll continue to keep the patient n.p.o.  2.Acute Renal failure (03/05/2011)  patient on IV fluids creatinine continues to improve. Urine sodium pending. Basic metabolic panel for this morning pending. Will continue IV fluids for now. We'll place a condom catheter Foley patient incontinent.  3.Hypochloremia (03/05/2011)  on  basic metabolic panel for today pending. Continue IV fluids.  4.Metabolic alkalosis (03/05/2011)  no diarrhea, basic metabolic panel pending.  5.Protein calorie malnutrition (03/05/2011)  nutrition consult,and ensure 3 times a day.   LOS: 2 days   FELIZ ORTIZ, Lucilia Yanni 03/07/2011, 8:28 AM

## 2011-03-07 NOTE — Progress Notes (Signed)
Pt to d/c home with [facility].  Please re-consult CSW if d/c plan changes to SNF.

## 2011-03-07 NOTE — Progress Notes (Signed)
  Subjective: Denies abd pain. Cannot remember having a bm.  Objective: Vital signs in last 24 hours: Temp:  [98.2 F (36.8 C)-98.3 F (36.8 C)] 98.3 F (36.8 C) 2023/03/26 0610) Pulse Rate:  [96-106] 96  03/26/2023 0610) Resp:  [17-18] 17  Mar 26, 2023 0610) BP: (97-111)/(62-65) 111/63 mmHg 2023/03/26 0610) SpO2:  [87 %-97 %] 97 % 2023/03/26 0610) Weight:  [114 lb 3.2 oz (51.801 kg)] 114 lb 3.2 oz (51.801 kg) (11/08 1434) Last BM Date: 03/06/11  Intake/Output from previous day: 11/08 0701 - 03-26-2023 0700 In: 1818.8 [I.V.:1818.8] Out: -  Intake/Output this shift:    PE:   Lab Results:   Basename 03/05/11 2103 03/05/11 0546  WBC 8.3 6.7  HGB 12.4* 13.2  HCT 37.5* 39.9  PLT 203 240   BMET  Basename 03/05/11 2103 03/05/11 0546  NA 141 141  K 3.6 4.0  CL 94* 92*  CO2 35* 36*  GLUCOSE 93 114*  BUN 79* 73*  CREATININE 3.32* 3.67*  CALCIUM 8.4 9.1   PT/INR No results found for this basename: LABPROT:2,INR:2 in the last 72 hours ABG No results found for this basename: PHART:2,PCO2:2,PO2:2,HCO3:2 in the last 72 hours  Studies/Results: Dg Abd 2 Views  Mar 26, 2011  *RADIOLOGY REPORT*  Clinical Data: Bowel obstruction.  ABDOMEN - 2 VIEW  Comparison: 03/06/2011, 03/05/2011.  Findings: Multiple dilated small bowel loops remain present which may have slightly improved in caliber over the last several days. There remains a large amount of fecal material in the colon which does not appear to have changed significantly.  Decubitus films show no evidence of free intraperitoneal air.  IMPRESSION: Possible mildly improving small bowel obstruction pattern.  No evidence of bowel perforation.  Original Report Authenticated By: Reola Calkins, M.D.   Dg Abd 2 Views  03/06/2011  *RADIOLOGY REPORT*  Clinical Data: Small bowel obstruction, stool burden  ABDOMEN - 2 VIEW  Comparison: 03/05/2011  Findings: Increase stool in colon. Persistent dilatation of small bowel loops compatible with small bowel  obstruction. No definite bowel wall thickening or free intraperitoneal air. Small bowel gas present in the rectum. Scattered atherosclerotic calcifications. Two right renal calculi, largest 8 mm diameter.  IMPRESSION: Persistent dilatation of small bowel loops compatible with small bowel obstruction. Largest loops have slightly increased in size since previous exam. Significant retained stool burden.  Original Report Authenticated By: Lollie Marrow, M.D.    Anti-infectives: Anti-infectives     Start     Dose/Rate Route Frequency Ordered Stop   03/05/11 1000   azithromycin (ZITHROMAX) 500 mg in dextrose 5 % 250 mL IVPB        500 mg 250 mL/hr over 60 Minutes Intravenous  Once 03/05/11 0931 03/05/11 1248   03/05/11 0945   cefTRIAXone (ROCEPHIN) 1 g in dextrose 5 % 50 mL IVPB  Status:  Discontinued        1 g 100 mL/hr over 30 Minutes Intravenous Every 24 hours 03/05/11 0931 03/05/11 1713          Assessment Principal Problem:  *Small bowel obstruction-likely secondary to constipation; slowly improving. Active Problems:  Renal failure  Hypochloremia  Metabolic alkalosis  Protein calorie malnutrition    LOS: 2 days   Plan: continue enemas and could try Miralax, gently.  Ivan Young 03-26-2011

## 2011-03-07 NOTE — Progress Notes (Signed)
Physical Therapy Evaluation Patient Details Name: Ivan Young MRN: [id number] DOB: 06-02-1916 Today's Date: 03/07/2011  Problem List:  Patient Active Problem List  Diagnoses  . COPD  . Renal failure  . Small bowel obstruction  . Hypochloremia  . Metabolic alkalosis  . Protein calorie malnutrition    Past Medical History:  Past Medical History  Diagnosis Date  . Alzheimer disease   . Asthma   . Bronchitis   . Cataracts, bilateral    Past Surgical History:  Past Surgical History  Procedure Date  . Prostatectomy 1992  . Lung removal, partial   . Cataract extraction, bilateral     PT Assessment/Plan/Recommendation PT Assessment Clinical Impression Statement: Pt is 77 y.o. male admitted for SBO, with significant Alzhiemers, impairing mobility and problem solving, but has full spouse support. Pt currently requires Min A for mobility and step-by-step instruction for safety.  PT Recommendation/Assessment: Patient will need skilled PT in the acute care venue PT Problem List: Decreased strength;Decreased activity tolerance;Decreased balance;Decreased mobility;Decreased cognition;Decreased knowledge of use of DME;Decreased safety awareness PT Therapy Diagnosis : Generalized weakness;Difficulty walking PT Plan PT Frequency: Min 3X/week PT Treatment/Interventions: DME instruction;Gait training;Stair training;Functional mobility training;Therapeutic exercise;Balance training;Neuromuscular re-education;Cognitive remediation;Patient/family education PT Recommendation Follow Up Recommendations: Home health PT;24 hour supervision/assistance Equipment Recommended: Rolling walker with 5" wheels PT Goals  Acute Rehab PT Goals PT Goal Formulation: With patient/family Time For Goal Achievement: 7 days Pt will go Supine/Side to Sit: Independently PT Goal: Supine/Side to Sit - Progress: Progressing toward goal Pt will go Sit to Supine/Side: Independently PT Goal: Sit to Supine/Side -  Progress: Progressing toward goal Pt will Transfer Sit to Stand/Stand to Sit: with supervision PT Transfer Goal: Sit to Stand/Stand to Sit - Progress: Progressing toward goal Pt will Ambulate: 51 - 150 feet;with rolling walker PT Goal: Ambulate - Progress: Progressing toward goal Pt will Go Up / Down Stairs: 3-5 stairs;with supervision;with rail(s) PT Goal: Up/Down Stairs - Progress: Progressing toward goal  PT Evaluation Precautions/Restrictions  Precautions Precautions: Fall Restrictions Weight Bearing Restrictions: No Prior Functioning  Home Living Lives With: Spouse Receives Help From: Family Type of Home: House Home Layout: One level;Able to live on main level with bedroom/bathroom Home Access: Stairs to enter Entrance Stairs-Rails: Can reach both Entrance Stairs-Number of Steps: 4 Bathroom Toilet: Standard Home Adaptive Equipment: None Additional Comments: PTA, pt was I with mobility, but wife A with ADLs Prior Function Level of Independence: Independent with gait;Needs assistance with ADLs Able to Take Stairs?: Yes Driving: No Vocation: Retired Financial risk analyst Arousal/Alertness: Awake/alert Overall Cognitive Status: Impaired Orientation Level: Oriented to person;Oriented to place;Other (Comment) (pt forgetfull ) Following Commands: Follows one step commands inconsistently (Requires repeated instruction - simple commands/phrases) Awareness of Deficits: Decreased awareness of deficits (Alzhiemers) Problem Solving: Requires assistance for problem solving Cognition - Other Comments: Requires step-by-step instruction Extremity Assessment RLE Assessment RLE Assessment: Within Functional Limits LLE Assessment LLE Assessment: Within Functional Limits Mobility (including Balance) Bed Mobility Bed Mobility: Yes Supine to Sit: 5: Supervision;HOB flat Supine to Sit Details (indicate cue type and reason): Cues for initiation Transfers Transfers: Yes Sit to Stand: 4:  Min assist;With upper extremity assist Sit to Stand Details (indicate cue type and reason): Cues for hand placement, no carry-over Stand to Sit: 4: Min assist;With upper extremity assist Stand to Sit Details: Cues for hand placement Ambulation/Gait Ambulation/Gait: Yes Ambulation/Gait Assistance: 4: Min assist Ambulation/Gait Assistance Details (indicate cue type and reason): Cues for RW mgt and isntruction on use Ambulation Distance (Feet):  12 Feet (12x2) Assistive device: Rolling walker Gait Pattern: Trunk flexed Stairs: No      End of Session PT - End of Session Equipment Utilized During Treatment: Gait belt Activity Tolerance: Patient tolerated treatment well Patient left: in chair;with call bell in reach;with family/visitor present Nurse Communication: Mobility status for ambulation General Behavior During Session: [facility] for tasks performed Cognition: Impaired, at baseline  [employer] 03/07/2011, 11:04 AM

## 2011-03-08 LAB — OSMOLALITY, URINE: Osmolality, Ur: 572 mOsm/kg (ref 390–1090)

## 2011-03-08 MED ORDER — HYDROCORTISONE 2.5 % RE CREA
TOPICAL_CREAM | Freq: Two times a day (BID) | RECTAL | Status: DC
  Administered 2011-03-08 – 2011-03-09 (×3): via RECTAL
  Filled 2011-03-08: qty 28.35

## 2011-03-08 MED ORDER — SODIUM CHLORIDE 0.9 % IV SOLN: INTRAVENOUS | Status: DC

## 2011-03-08 NOTE — Progress Notes (Signed)
  Subjective: In good spirits.  Bowels moving. Objective: Vital signs in last 24 hours: Temp:  [98 F (36.7 C)-98.6 F (37 C)] 98.6 F (37 C) (11/10 0643) Pulse Rate:  [90-96] 90  (11/10 0643) Resp:  [18-20] 18  (11/10 0643) BP: (106-125)/(68-71) 125/68 mmHg (11/10 0643) SpO2:  [91 %-98 %] 94 % (11/10 0643) Weight:  [116 lb 2.9 oz (52.7 kg)] 116 lb 2.9 oz (52.7 kg) (11/10 0643) Last BM Date: Mar 28, 2011 (after enema)  Intake/Output from previous day: Mar 28, 2023 0701 - 11/10 0700 In: 900 [I.V.:900] Out: 680 [Urine:680] Intake/Output this shift:    PE: Abd- significantly softer and less distended  Lab Results:   [facility] 03/05/11 2103  WBC 8.3  HGB 12.4*  HCT 37.5*  PLT 203   BMET  Basename 03/28/11 0920 03/05/11 2103  NA 144 141  K 3.3* 3.6  CL 97 94*  CO2 35* 35*  GLUCOSE 97 93  BUN 84* 79*  CREATININE 1.81* 3.32*  CALCIUM 8.8 8.4   PT/INR No results found for this basename: LABPROT:2,INR:2 in the last 72 hours ABG No results found for this basename: PHART:2,PCO2:2,PO2:2,HCO3:2 in the last 72 hours  Studies/Results: Dg Abd 2 Views  03-28-11  *RADIOLOGY REPORT*  Clinical Data: Bowel obstruction.  ABDOMEN - 2 VIEW  Comparison: 03/06/2011, 03/05/2011.  Findings: Multiple dilated small bowel loops remain present which may have slightly improved in caliber over the last several days. There remains a large amount of fecal material in the colon which does not appear to have changed significantly.  Decubitus films show no evidence of free intraperitoneal air.  IMPRESSION: Possible mildly improving small bowel obstruction pattern.  No evidence of bowel perforation.  Original Report Authenticated By: Reola Calkins, M.D.   Dg Abd 2 Views  03/06/2011  *RADIOLOGY REPORT*  Clinical Data: Small bowel obstruction, stool burden  ABDOMEN - 2 VIEW  Comparison: 03/05/2011  Findings: Increase stool in colon. Persistent dilatation of small bowel loops compatible with small bowel  obstruction. No definite bowel wall thickening or free intraperitoneal air. Small bowel gas present in the rectum. Scattered atherosclerotic calcifications. Two right renal calculi, largest 8 mm diameter.  IMPRESSION: Persistent dilatation of small bowel loops compatible with small bowel obstruction. Largest loops have slightly increased in size since previous exam. Significant retained stool burden.  Original Report Authenticated By: Lollie Marrow, M.D.    Anti-infectives: Anti-infectives     Start     Dose/Rate Route Frequency Ordered Stop   03/05/11 1000   azithromycin (ZITHROMAX) 500 mg in dextrose 5 % 250 mL IVPB        500 mg 250 mL/hr over 60 Minutes Intravenous  Once 03/05/11 0931 03/05/11 1248   03/05/11 0945   cefTRIAXone (ROCEPHIN) 1 g in dextrose 5 % 50 mL IVPB  Status:  Discontinued        1 g 100 mL/hr over 30 Minutes Intravenous Every 24 hours 03/05/11 0931 03/05/11 1713          Assessment Principal Problem:  * Bowel obstruction-likely secondary to constipation-significant improvement in past 24 hours Active Problems:  Renal failure  Hypochloremia  Metabolic alkalosis  Protein calorie malnutrition    LOS: 3 days   Plan: Agree with starting diet and advancing as tolerated.  Will be available if needed.  Sir Mallis J 03/08/2011

## 2011-03-08 NOTE — Progress Notes (Signed)
Subjective: Multiple bowel movements yesterday with enemas and without enemas. Pt with more strength today. But relates his abdomen distention improved today. Objective: Filed Vitals:   03/07/11 1010 03/07/11 1427 03/07/11 2150 03/08/11 0643  BP:  116/71 106/68 125/68  Pulse: 96 96 96 90  Temp:  98 F (36.7 C) 98.6 F (37 C) 98.6 F (37 C)  TempSrc:  Oral Oral Oral  Resp:  20 18 18   Height:      Weight:    52.7 kg (116 lb 2.9 oz)  SpO2: 91% 98% 94% 94%   Weight change: 0.899 kg (1 lb 15.7 oz)  Intake/Output Summary (Last 24 hours) at 03/08/11 0731 Last data filed at 03/08/11 0646  Gross per 24 hour  Intake    900 ml  Output    680 ml  Net    220 ml    General: Alert, awake, oriented x2, in no acute distress.  HEENT: No bruits, no goiter.  Heart: Regular rate and rhythm, without murmurs, rubs, gallops.  Lungs: Crackles left side, bilateral air movement.  Abdomen: Soft, no distention, not tender to palpation. And positive bowel sounds. Neuro: Grossly intact, nonfocal.   Lab Results:  [facility] 03/07/11 0920 03/05/11 2103  NA 144 141  K 3.3* 3.6  CL 97 94*  CO2 35* 35*  GLUCOSE 97 93  BUN 84* 79*  CREATININE 1.81* 3.32*  CALCIUM 8.8 8.4  MG -- --  PHOS -- --    Basename 03/05/11 2103  AST 29  ALT 10  ALKPHOS 75  BILITOT 0.3  PROT 7.1  ALBUMIN 3.0*   No results found for this basename: LIPASE:2,AMYLASE:2 in the last 72 hours  Basename 03/05/11 2103  WBC 8.3  NEUTROABS --  HGB 12.4*  HCT 37.5*  MCV 90.6  PLT 203    Micro Results: No results found for this or any previous visit (from the past 240 hour(s)).  Studies/Results: Ct Abdomen Pelvis Wo Contrast  03/05/2011  *RADIOLOGY REPORT*  Clinical Data: Pain, vomiting, elevated creatinine  CT ABDOMEN AND PELVIS WITHOUT CONTRAST IMPRESSION:  1.  Distal small bowel obstruction, etiology uncertain. 2.  Bibasilar pulmonary air space opacities consistent with pneumonia. 3.  3.9 cm abdominal aortic aneurysm.  4.  Right nephrolithiasis without hydronephrosis.  Original Report Authenticated By: Osa Craver, M.D.   Dg Abd 2 Views  03/06/2011  *RADIOLOGY REPORT*  Clinical Data: Small bowel obstruction, stool burden  ABDOMEN - 2 VIEW  Comparison: 03/05/2011  IMPRESSION: Persistent dilatation of small bowel loops compatible with small bowel obstruction. Largest loops have slightly increased in size since previous exam. Significant retained stool burden.  Original Report Authenticated By: Lollie Marrow, M.D.    Medications: I have reviewed the patient's current medications.   Patient Active Hospital Problem List: 1.Small bowel obstruction (03/05/2011)  abdominal x-ray from yesterday showed improved dilated bowels. The patient has significant improved distention. Has positive bowel sounds. Has no complaints. Has been tolerating his MiraLAX. And multiple bowel movements yesterday. We'll start him on liquid diet .  2.Acute Renal failure (03/05/2011)  patient on IV fluids creatinine continues to improve. Will continue IV fluids for now at a slower rate. We'll place a condom catheter Foley patient incontinent. Basic metabolic panel in the morning.  3.Hypochloremia (03/05/2011)  resolved.  4.Metabolic alkalosis (03/05/2011)  no diarrhea, basic metabolic panel in the morning.  5.Protein calorie malnutrition (03/05/2011)  nutrition consult,and ensure 3 times a day.   LOS: 3 days  FELIZ ORTIZ, Kenzlie Disch 03/08/2011, 7:31 AM

## 2011-03-09 DIAGNOSIS — K5900 Constipation, unspecified: Secondary | ICD-10-CM | POA: Diagnosis present

## 2011-03-09 LAB — BASIC METABOLIC PANEL
BUN: 39 mg/dL — ABNORMAL HIGH (ref 6–23)
CO2: 34 mEq/L — ABNORMAL HIGH (ref 19–32)
Chloride: 110 mEq/L (ref 96–112)
Creatinine, Ser: 0.92 mg/dL (ref 0.50–1.35)
Glucose, Bld: 94 mg/dL (ref 70–99)

## 2011-03-09 MED ORDER — POLYETHYLENE GLYCOL 3350 17 G PO PACK: 17.0000 g | PACK | Freq: Every day | ORAL | Status: AC

## 2011-03-09 MED ORDER — PHOSPHATE ENEMA 7-19 GM/118ML RE ENEM: 1.0000 | ENEMA | Freq: Once | RECTAL | Status: AC | PRN

## 2011-03-09 MED ORDER — SENNOSIDES-DOCUSATE SODIUM 8.6-50 MG PO TABS: 2.0000 | ORAL_TABLET | Freq: Two times a day (BID) | ORAL | Status: DC

## 2011-03-09 MED ORDER — POTASSIUM CHLORIDE CRYS ER 20 MEQ PO TBCR
40.0000 meq | EXTENDED_RELEASE_TABLET | Freq: Three times a day (TID) | ORAL | Status: DC
  Administered 2011-03-09 (×2): 40 meq via ORAL
  Filled 2011-03-09 (×3): qty 2

## 2011-03-09 MED ORDER — HYDROCORTISONE 2.5 % RE CREA: TOPICAL_CREAM | Freq: Two times a day (BID) | RECTAL | Status: AC

## 2011-03-09 MED ORDER — INFLUENZA VIRUS VACC SPLIT PF IM SUSP
0.5000 mL | INTRAMUSCULAR | Status: AC
  Administered 2011-03-09: 0.5 mL via INTRAMUSCULAR
  Filled 2011-03-09: qty 0.5

## 2011-03-09 MED ORDER — SENNOSIDES-DOCUSATE SODIUM 8.6-50 MG PO TABS
2.0000 | ORAL_TABLET | Freq: Two times a day (BID) | ORAL | Status: DC
  Administered 2011-03-09: 2 via ORAL
  Filled 2011-03-09 (×4): qty 2

## 2011-03-09 NOTE — Discharge Summary (Addendum)
Ivan Young MRN: [id number] DOB/AGE: 05-03-1916 77 y.o.  Admit date: 03/05/2011 Discharge date: 03/09/2011  Primary Care Physician:  No primary provider on file.   Discharge Diagnoses:   Active Hospital Problems  Diagnoses Date Noted   . Obstipation 03/09/2011   . Renal failure 03/05/2011   . Hypochloremia 03/05/2011   . Metabolic alkalosis 03/05/2011   . Protein calorie malnutrition 03/05/2011     Resolved Hospital Problems  Diagnoses Date Noted Date Resolved  . Small bowel obstruction 03/05/2011 03/09/2011     DISCHARGE MEDICATION: Current Discharge Medication List    START taking these medications   Details  hydrocortisone (ANUSOL-HC) 2.5 % rectal cream Place rectally 2 (two) times daily. Qty: 30 g, Refills: 0    polyethylene glycol (MIRALAX / GLYCOLAX) packet Take 17 g by mouth daily. Qty: 14 each, Refills: 0    senna-docusate (SENOKOT-S) 8.6-50 MG per tablet Take 2 tablets by mouth 2 (two) times daily. Qty: 30 tablet, Refills: 0      STOP taking these medications     hyoscyamine (LEVSIN, ANASPAZ) 0.125 MG tablet      promethazine (PHENERGAN) 25 MG suppository             Consults:   [place] [facility] Dr. Kae Heller  SIGNIFICANT DIAGNOSTIC STUDIES:  Ct Abdomen Pelvis Wo Contrast  03/05/2011  *RADIOLOGY REPORT*  Clinical Data: Pain, vomiting, elevated creatinine  CT ABDOMEN AND PELVIS WITHOUT CONTRAST  Technique:  Multidetector CT imaging of the abdomen and pelvis was performed following the standard protocol without intravenous contrast.  Comparison: None.  Findings: Coarse airspace opacities in the visualized posterior lower lobes, left worse than right.  Moderately advanced emphysematous changes are also noted.  Coronary and aortic calcifications.  There is a 3.9 cm infrarenal fusiform abdominal aortic aneurysm.  Stomach is distended.  There are multiple dilated small bowel loops throughout the abdomen with scattered fluid levels.  The distal most small  bowel appears decompressed.  There is moderate fecal material throughout the nondilated colon and rectum.  Urinary bladder is decompressed.  No definite mass or abscess, sensitivity decreased secondary to paucity of intra-abdominal fat and lack of oral   or IV contrast.  Unremarkable uninfused evaluation of the liver, spleen, adrenal glands, left kidney. There is right nephrolithiasis, largest stone in the interpolar region 10 mm; no hydronephrosis.  Pancreas is not well delineated. Extensive iliofemoral arterial calcifications.  IMPRESSION:  1.  Distal small bowel obstruction, etiology uncertain. 2.  Bibasilar pulmonary air space opacities consistent with pneumonia. 3.  3.9 cm abdominal aortic aneurysm. 4.  Right nephrolithiasis without hydronephrosis.  Original Report Authenticated By: Osa Craver, M.D.   Dg Abd 2 Views  03/07/2011  *RADIOLOGY REPORT*  Clinical Data: Bowel obstruction.  ABDOMEN - 2 VIEW  Comparison: 03/06/2011, 03/05/2011.  Findings: Multiple dilated small bowel loops remain present which may have slightly improved in caliber over the last several days. There remains a large amount of fecal material in the colon which does not appear to have changed significantly.  Decubitus films show no evidence of free intraperitoneal air.  IMPRESSION: Possible mildly improving small bowel obstruction pattern.  No evidence of bowel perforation.  Original Report Authenticated By: Reola Calkins, M.D.   Dg Abd 2 Views  03/06/2011  *RADIOLOGY REPORT*  Clinical Data: Small bowel obstruction, stool burden  ABDOMEN - 2 VIEW  Comparison: 03/05/2011  Findings: Increase stool in colon. Persistent dilatation of small bowel loops compatible with small bowel obstruction. No definite  bowel wall thickening or free intraperitoneal air. Small bowel gas present in the rectum. Scattered atherosclerotic calcifications. Two right renal calculi, largest 8 mm diameter.  IMPRESSION: Persistent dilatation of small  bowel loops compatible with small bowel obstruction. Largest loops have slightly increased in size since previous exam. Significant retained stool burden.  Original Report Authenticated By: Lollie Marrow, M.D.   Dg Abd Acute W/chest  03/05/2011  *RADIOLOGY REPORT*  Clinical Data: Upper abdominal pain, nausea, vomiting.  ACUTE ABDOMEN SERIES (ABDOMEN 2 VIEW & CHEST 1 VIEW)  Comparison: 01/08/2004 CT  Findings: Centrolobular and subpleural emphysematous changes. 1.4 cm right upper lung nodule is similar to 2005.  Tortuous aortic arch with atherosclerotic calcification.  The cardiac contour within normal limits.  Dilated loops of small bowel, with air-fluid levels.  Moderate stool burden.  No free intraperitoneal air identified.  Diffuse osteopenia.  Calcific density projecting over the right upper quadrant may represent a gallstone or renal stone.  IMPRESSION: Dilated loops of bowel.  Small bowel obstruction not excluded. There is a moderate stool burden. Consider CT to better characterize.  Original Report Authenticated By: Waneta Martins, M.D.      No results found for this or any previous visit (from the past 240 hour(s)).  BRIEF ADMITTING H & P: There is a 77 year old male with past medical history of Alzheimer's was not able to give a history but his daughter is at bedside gave most of the history. He has been in usual of health living at home tolerating his normal diet until Friday. He started throwing up. When he saw his primary care doctor given some suppositories for constipation. And told him that if this does not get better he should go to ED. over the weekend he started vomiting. Was not able to tolerate anything by mouth. He started getting dizzy upon standing and actually fell but did not had his head. Chest x-ray was done here in the ED with a CT scan that showed an obstruction. We were asked to admit and further evaluate.  The patient relates no chest pain shortness of breath diarrhea.  He does not know when his last bowel movement was.  Hospital Admission Note Date: 03/05/2011   Patient name: Ivan Young            Medical record number: [id number] Date of birth: 01-Nov-1916      Age: 77 y.o.     Gender: male PCP: No primary provider on file.   Chief Complaint: Nausea and vomiting   History of Present Illness: There is a 77 year old male with past medical history of Alzheimer's was not able to give a history but his daughter is at bedside gave most of the history. He has been in usual of health living at home tolerating his normal diet until Friday. He started throwing up. When he saw his primary care doctor given some suppositories for constipation. And told him that if this does not get better he should go to ED. over the weekend he started vomiting. Was not able to tolerate anything by mouth. He started getting dizzy upon standing and actually fell but did not had his head. Chest x-ray was done here in the ED with a CT scan that showed an obstruction. We were asked to admit and further evaluate.  The patient relates no chest pain shortness of breath diarrhea. He does not know when his last bowel movement was     Allergies: Review of patient's allergies indicates no known allergies.  Past Medical History   Diagnosis  Date   .  Alzheimer disease     .  Asthma     .  Bronchitis     .  Cataracts, bilateral      Prior to Admission medications    Medication  Sig  Start Date  End Date  Taking?  Authorizing Provider   hyoscyamine (LEVSIN, ANASPAZ) 0.125 MG tablet  Take 0.125 mg by mouth 3 (three) times daily with meals. Stomach pain       Yes  Historical Provider, MD   promethazine (PHENERGAN) 25 MG suppository  Place 25 mg rectally every 6 (six) hours as needed. Nausea/vomiting       Yes  Historical Provider, MD       Past Surgical History   Procedure  Date   .  Prostatectomy  1992   .  Lung removal, partial     .  Cataract extraction, bilateral      History  reviewed. No pertinent family history. History       Social History   .  Marital Status:  Married       Spouse Name:  N/A       Number of Children:  N/A   .  Years of Education:  N/A       Occupational History   .  Not on file.       Social History Main Topics   .  Smoking status:  Former Smoker -- 0.2 packs/day   .  Smokeless tobacco:  Not on file   .  Alcohol Use:  0.6 oz/week       1 Shots of liquor per week   .  Drug Use:  No   .  Sexually Active:  No       Other Topics  Concern   .  Not on file       Social History Narrative   .  No narrative on file    Review of Systems: Pertinent items are noted in HPI. Physical Exam: Filed Vitals:     03/05/11 0329  03/05/11 0411  03/05/11 0644   BP:  89/63  93/62  130/66   Pulse:  119  113  105   Temp:  97.7 F (36.5 C)  97.5 F (36.4 C)  97.8 F (36.6 C)   TempSrc:  Oral  Oral  Oral   Resp:  20  19  16    SpO2:  97%  85%      Intake/Output Summary (Last 24 hours) at 03/05/11 1655 Last data filed at 03/05/11 1301   Gross per 24 hour   Intake       0 ml   Output       0 ml   Net       0 ml    BP 130/66  Pulse 105  Temp(Src) 97.8 F (36.6 C) (Oral)  Resp 16  SpO2 85%    General Appearance:     Alert, cooperative, no distress, appears stated age   Head:     Normocephalic, without obvious abnormality, atraumatic   Eyes:     PERRL, conjunctiva/corneas clear, EOM's intact, fundi      benign, both eyes        Ears:     Normal TM's and external ear canals, both ears   Nose:    Nares normal, septum midline, mucosa normal, no drainage    or sinus tenderness   Throat:  Lips, mucosa, and tongue normal; teeth and gums normal   Neck:    Supple, symmetrical, trachea midline, no adenopathy;         thyroid:  No enlargement/tenderness/nodules; no carotid   bruit or JVD   Back:      Symmetric, no curvature, ROM normal, no CVA tenderness   Lungs:      Clear to auscultation bilaterally, respirations unlabored   Chest  wall:     No tenderness or deformity   Heart:     Regular rate and rhythm, S1 and S2 normal, no murmur, rub   or gallop   Abdomen:      Soft, non-tender, bowel sounds active all four quadrants,      no masses, no organomegaly   Genitalia:    deferred    Rectal:    deferred   Extremities:    Extremities normal, atraumatic, no cyanosis or edema   Pulses:    2+ and symmetric all extremities   Skin:    Skin color, texture, turgor normal, no rashes or lesions   Lymph nodes:    Cervical, supraclavicular, and axillary nodes normal   Neurologic:    CNII-XII intact. Normal strength, sensation and reflexes        throughout      Lab results: [facility]  03/05/11 0546   NA  141   K  4.0   CL  92*   CO2  36*   GLUCOSE  114*   BUN  73*   CREATININE  3.67*   CALCIUM  9.1   MG  --   PHOS  --    Basename  03/05/11 0546   AST  23   ALT  9   ALKPHOS  82   BILITOT  0.7   PROT  8.0   ALBUMIN  3.4*    Basename  03/05/11 0546   LIPASE  7*   AMYLASE  --    Basename  03/05/11 0546   WBC  6.7   NEUTROABS  --   HGB  13.2   HCT  39.9   MCV  90.7   PLT  240          Active Hospital Problems  Diagnoses Date Noted   . Obstipation: Patient was admitted to the floor, CT scan was done surgery was consulted. They agreed with enemas and stool softeners. He was given several days of enemas.The Levsin was stopped as this may be contributing to the constipation. He had multiple bowel movements. His bili distention decreased. He MiraLAX was added. He continued to have bowel movements. By the next day he was on clear liquid diet which he tolerated well. This wouldn't advance and he tolerated this. So he was discharged in stable condition on MiraLAX and Senokot.  03/09/2011   . Renal failure: This was most likely secondary to dehydration 2 nausea and vomiting did resolve with IV fluids.  03/05/2011   . Hypochloremia: This is probably secondary to dehydration he was given IV fluids does resolve.   03/05/2011   . Metabolic alkalosis: Probably secondary to contraction alkalosis this resolved with IV fluids  03/05/2011   . Protein calorie malnutrition: Nutrition was consulted. And ensure was added he would continue Ensure 3 times a day.  03/05/2011        Decondition: I spoke the the wife about SNF, she relates she would want to take him home as long as       Can get up  from bed. PT was consulted and they recommended a rolling walker which was order      And physical therapy for home. I have also order home health nurse for home.  Resolved Hospital Problems  Diagnoses Date Noted Date Resolved  . Small bowel obstruction: This is probably secondary to obstipation. Please see above.  03/05/2011 03/09/2011     Disposition and Follow-up:  Discharge Orders    Future Orders Please Complete By Expires   Diet - low sodium heart healthy      Increase activity slowly           DISCHARGE EXAM:  Filed Vitals:    03/07/11 1010  03/07/11 1427  03/07/11 2150  03/08/11 0643   BP:   116/71  106/68  125/68   Pulse:  96  96  96  90   Temp:   98 F (36.7 C)  98.6 F (37 C)  98.6 F (37 C)   TempSrc:   Oral  Oral  Oral   Resp:   20  18  18    Height:       Weight:     52.7 kg (116 lb 2.9 oz)   SpO2:  91%  98%  94%  94%    Weight change: 0.899 kg (1 lb 15.7 oz)   Intake/Output Summary (Last 24 hours) at 03/08/11 0731 Last data filed at 03/08/11 0646   Gross per 24 hour   Intake  900 ml   Output  680 ml   Net  220 ml    General: Alert, awake, oriented x2, in no acute distress.  HEENT: No bruits, no goiter.  Heart: Regular rate and rhythm, without murmurs, rubs, gallops.  Lungs: Crackles left side, bilateral air movement.  Abdomen: Soft, no distention, not tender to palpation. And positive bowel sounds.  Neuro: Grossly intact, nonfocal.   Blood pressure 138/83, pulse 79, temperature 97.6 F (36.4 C), temperature source Oral, resp. rate 20, height 6' (1.829 m), weight 52.7 kg (116  lb 2.9 oz), SpO2 92.00%.   Basename 03/07/11 0920  NA 144  K 3.3*  CL 97  CO2 35*  GLUCOSE 97  BUN 84*  CREATININE 1.81*  CALCIUM 8.8  MG --  PHOS --   No results found for this basename: AST:2,ALT:2,ALKPHOS:2,BILITOT:2,PROT:2,ALBUMIN:2 in the last 72 hours No results found for this basename: LIPASE:2,AMYLASE:2 in the last 72 hours No results found for this basename: WBC:2,NEUTROABS:2,HGB:2,HCT:2,MCV:2,PLT:2 in the last 72 hours  Signed: David Stall, Socrates Cahoon 03/09/2011, 8:19 AM

## 2011-03-09 NOTE — Progress Notes (Signed)
CM spoke with pt with wife at bedside. Per pt choice [facility] to provide RW, HHPT. No HHA needed. [facility] notified of referral.

## 2011-03-13 ENCOUNTER — Encounter (HOSPITAL_COMMUNITY): Payer: Self-pay | Admitting: Emergency Medicine

## 2011-10-05 ENCOUNTER — Emergency Department (INDEPENDENT_AMBULATORY_CARE_PROVIDER_SITE_OTHER)
Admission: EM | Admit: 2011-10-05 | Discharge: 2011-10-05 | Disposition: A | Discharge status: Nursing Home (Includes ICF) | Payer: Federal, State, Local not specified - PPO | Source: Home / Self Care | Attending: Family Medicine | Admitting: Family Medicine

## 2011-10-05 ENCOUNTER — Emergency Department (INDEPENDENT_AMBULATORY_CARE_PROVIDER_SITE_OTHER): Payer: Federal, State, Local not specified - PPO

## 2011-10-05 ENCOUNTER — Encounter (HOSPITAL_COMMUNITY): Payer: Self-pay | Admitting: *Deleted

## 2011-10-05 ENCOUNTER — Emergency Department (HOSPITAL_COMMUNITY)
Admission: EM | Admit: 2011-10-05 | Discharge: 2011-10-05 | Disposition: A | Discharge status: Home / Self Care | Payer: Federal, State, Local not specified - PPO | Attending: Emergency Medicine | Admitting: Emergency Medicine

## 2011-10-05 ENCOUNTER — Encounter (HOSPITAL_COMMUNITY): Payer: Self-pay

## 2011-10-05 ENCOUNTER — Emergency Department (HOSPITAL_COMMUNITY): Payer: Federal, State, Local not specified - PPO

## 2011-10-05 DIAGNOSIS — R5381 Other malaise: Secondary | ICD-10-CM

## 2011-10-05 DIAGNOSIS — G309 Alzheimer's disease, unspecified: Secondary | ICD-10-CM | POA: Insufficient documentation

## 2011-10-05 DIAGNOSIS — R911 Solitary pulmonary nodule: Secondary | ICD-10-CM | POA: Insufficient documentation

## 2011-10-05 DIAGNOSIS — F0280 Dementia in other diseases classified elsewhere without behavioral disturbance: Secondary | ICD-10-CM | POA: Insufficient documentation

## 2011-10-05 DIAGNOSIS — J40 Bronchitis, not specified as acute or chronic: Secondary | ICD-10-CM

## 2011-10-05 DIAGNOSIS — J438 Other emphysema: Secondary | ICD-10-CM | POA: Insufficient documentation

## 2011-10-05 DIAGNOSIS — R059 Cough, unspecified: Secondary | ICD-10-CM | POA: Insufficient documentation

## 2011-10-05 DIAGNOSIS — R05 Cough: Secondary | ICD-10-CM

## 2011-10-05 LAB — BASIC METABOLIC PANEL
CO2: 26 mEq/L (ref 19–32)
Calcium: 8.9 mg/dL (ref 8.4–10.5)
Creatinine, Ser: 0.64 mg/dL (ref 0.50–1.35)
GFR calc non Af Amer: 82 mL/min — ABNORMAL LOW (ref >90)
Glucose, Bld: 83 mg/dL (ref 70–99)

## 2011-10-05 LAB — URINALYSIS, ROUTINE W REFLEX MICROSCOPIC
Glucose, UA: NEGATIVE mg/dL
Hgb urine dipstick: NEGATIVE
Ketones, ur: 15 mg/dL — AB
Protein, ur: NEGATIVE mg/dL

## 2011-10-05 LAB — DIFFERENTIAL
Eosinophils Absolute: 0 10*3/uL (ref 0.0–0.7)
Eosinophils Relative: 0 % (ref 0–5)
Lymphocytes Relative: 10 % — ABNORMAL LOW (ref 12–46)
Lymphs Abs: 0.6 10*3/uL — ABNORMAL LOW (ref 0.7–4.0)
Monocytes Absolute: 0.4 10*3/uL (ref 0.1–1.0)

## 2011-10-05 LAB — CBC
HCT: 36.5 % — ABNORMAL LOW (ref 39.0–52.0)
MCH: 30.3 pg (ref 26.0–34.0)
MCV: 92.2 fL (ref 78.0–100.0)
RBC: 3.96 MIL/uL — ABNORMAL LOW (ref 4.22–5.81)
RDW: 14.8 % (ref 11.5–15.5)
WBC: 6 10*3/uL (ref 4.0–10.5)

## 2011-10-05 MED ORDER — AZITHROMYCIN 250 MG PO TABS: 250.0000 mg | ORAL_TABLET | Freq: Every day | ORAL | Status: AC

## 2011-10-05 MED ORDER — IOHEXOL 300 MG/ML  SOLN
80.0000 mL | Freq: Once | INTRAMUSCULAR | Status: AC | PRN
  Administered 2011-10-05: 80 mL via INTRAVENOUS

## 2011-10-05 MED ORDER — HYDROCODONE-ACETAMINOPHEN 7.5-500 MG/15ML PO SOLN: 15.0000 mL | Freq: Four times a day (QID) | ORAL | Status: AC | PRN

## 2011-10-05 MED ORDER — SODIUM CHLORIDE 0.9 % IV BOLUS (SEPSIS): 1000.0000 mL | Freq: Once | INTRAVENOUS | Status: DC

## 2011-10-05 NOTE — ED Notes (Signed)
Patient is unable to void at present. 

## 2011-10-05 NOTE — ED Notes (Addendum)
Spouse reports pt has a productive cough w/tan colored sputum x1 week, pt did not eat anything Monday, yesterday (sat.), or today. Denies N/V/D, fever, chills, chest/abd/back pain, or dizziness.

## 2011-10-05 NOTE — ED Provider Notes (Signed)
History     CSN: [id number]  Arrival date & time 10/05/11  1317   First MD Initiated Contact with Patient 10/05/11 1504      Chief Complaint  Patient presents with  . Cough    (Consider location/radiation/quality/duration/timing/severity/associated sxs/prior treatment) HPI  78 year old male with history of Alzheimer disease, history of asthma, and history of bronchitis presents complaining of cough and generalized weakness. Per previous note from the urgent care Center that the patient was seen today, patient has been having cough with productive green sputum for one week. He has had decreased appetite and has not been eating since yesterday.  He is weaker than usual per wife.  A CXR was performed at [facility] which shows no evidence of pna but does show a nodule density on R upper lobe which may required CT scan for further evaluation.  Per wife, pt has been his usual self up until last week. Pt denies fever, chills, cp, n/v/d, abd pain or urinary sxs.  No discernable weight lost or night sweat per wife.  Hx was limited due to his alzheimer disease.    Past Medical History  Diagnosis Date  . Alzheimer disease   . Asthma   . Bronchitis   . Cataracts, bilateral     Past Surgical History  Procedure Date  . Prostatectomy 1992  . Lung removal, partial   . Cataract extraction, bilateral     History reviewed. No pertinent family history.  History  Substance Use Topics  . Smoking status: Former Smoker -- 0.2 packs/day  . Smokeless tobacco: Not on file  . Alcohol Use: No      Review of Systems  Unable to perform ROS: Dementia    Allergies  Review of patient's allergies indicates no known allergies.  Home Medications   Current Outpatient Rx  Name Route Sig Dispense Refill  . DM-GUAIFENESIN ER 30-600 MG PO TB12 Oral Take 1 tablet by mouth every 12 (twelve) hours.    . DONEPEZIL HCL 10 MG PO TABS Oral Take 10 mg by mouth at bedtime.       BP 160/62  Pulse 79  Temp(Src)  97.6 F (36.4 C) (Oral)  Resp 19  SpO2 94%  Physical Exam  Nursing note and vitals reviewed. Constitutional: He appears well-developed and well-nourished. No distress.  HENT:  Head: Normocephalic and atraumatic.  Mouth/Throat: Oropharynx is clear and moist.       Full dentures in place.  Ear: Mild erythema to TM bilaterally with no overt signs of infection Nose: rhinorrhea Throat: oral mucosa moist and clear, uvula midline  Eyes: Conjunctivae are normal.  Neck: Normal range of motion. Neck supple.  Cardiovascular: Normal rate and regular rhythm.  Exam reveals no gallop and no friction rub.   No murmur heard. Pulmonary/Chest: Effort normal. No respiratory distress. He has no wheezes. He has no rales. He exhibits no tenderness.       Pt cough with each inspirations  Abdominal: Soft. There is no tenderness.  Musculoskeletal: Normal range of motion. He exhibits no edema.       NROM, equal strength bilaterally to all extremities.  Sensation intact to light touch.  No pedal edema  Neurological: He is alert.  Skin: Skin is warm. No rash noted.    ED Course  Procedures (including critical care time)  Labs Reviewed - No data to display Dg Chest 2 View  10/05/2011  *RADIOLOGY REPORT*  Clinical Data: Productive cough  CHEST - 2 VIEW  Comparison: None.  Findings: Cardiomediastinal silhouette is unremarkable. Hyperinflation is noted.  Osteopenia and mild degenerative changes thoracic spine.  There is mild compression deformity mid to lower thoracic spine of indeterminate age.  Clinical correlation is necessary.  There is a nodular density in the right upper lobe perihilar region measures 1.3 cm.  Further evaluation with CT scan of the chest is recommended.  Mild perihilar and infrahilar bronchitic changes.  Probable fibrotic changes or atelectasis left base. No segmental infiltrate or pulmonary edema.  IMPRESSION: Hyperinflation is noted.  Osteopenia and mild degenerative changes thoracic spine.   There is mild compression deformity mid to lower thoracic spine of indeterminate age.  Clinical correlation is necessary.  There is a nodular density in the right upper lobe perihilar region measures 1.3 cm.  Further evaluation with CT scan of the chest is recommended.  Mild perihilar and infrahilar bronchitic changes.  Probable fibrotic changes or atelectasis left base. No segmental infiltrate or pulmonary edema.  Original Report Authenticated By: Natasha Mead, M.D.     No diagnosis found.   Date: 10/05/2011  Rate: 72  Rhythm: sinus arrhythmia  QRS Axis: normal  Intervals: normal  ST/T Wave abnormalities: normal  Conduction Disutrbances:none  Narrative Interpretation:   Old EKG Reviewed: changes noted, normal ECG today compares to nov 7th, 2012  Results for orders placed during the hospital encounter of 10/05/11  CBC      Component Value Range   WBC 6.0  4.0 - 10.5 (K/uL)   RBC 3.96 (*) 4.22 - 5.81 (MIL/uL)   Hemoglobin 12.0 (*) 13.0 - 17.0 (g/dL)   HCT 29.5 (*) 62.1 - 52.0 (%)   MCV 92.2  78.0 - 100.0 (fL)   MCH 30.3  26.0 - 34.0 (pg)   MCHC 32.9  30.0 - 36.0 (g/dL)   RDW 30.8  65.7 - 84.6 (%)   Platelets    150 - 400 (K/uL)   Value: PLATELET CLUMPS NOTED ON SMEAR, COUNT APPEARS DECREASED  DIFFERENTIAL      Component Value Range   Neutrophils Relative 82 (*) 43 - 77 (%)   Neutro Abs 4.9  1.7 - 7.7 (K/uL)   Lymphocytes Relative 10 (*) 12 - 46 (%)   Lymphs Abs 0.6 (*) 0.7 - 4.0 (K/uL)   Monocytes Relative 7  3 - 12 (%)   Monocytes Absolute 0.4  0.1 - 1.0 (K/uL)   Eosinophils Relative 0  0 - 5 (%)   Eosinophils Absolute 0.0  0.0 - 0.7 (K/uL)   Basophils Relative 1  0 - 1 (%)   Basophils Absolute 0.0  0.0 - 0.1 (K/uL)  BASIC METABOLIC PANEL      Component Value Range   Sodium 142  135 - 145 (mEq/L)   Potassium 4.5  3.5 - 5.1 (mEq/L)   Chloride 105  96 - 112 (mEq/L)   CO2 26  19 - 32 (mEq/L)   Glucose, Bld 83  70 - 99 (mg/dL)   BUN 16  6 - 23 (mg/dL)   Creatinine, Ser 9.62   0.50 - 1.35 (mg/dL)   Calcium 8.9  8.4 - 95.2 (mg/dL)   GFR calc non Af Amer 82 (*) >90 (mL/min)   GFR calc Af Amer >90  >90 (mL/min)  URINALYSIS, ROUTINE W REFLEX MICROSCOPIC      Component Value Range   Color, Urine AMBER (*) YELLOW    APPearance CLOUDY (*) CLEAR    Specific Gravity, Urine 1.024  1.005 - 1.030    pH 5.5  5.0 - 8.0    Glucose, UA NEGATIVE  NEGATIVE (mg/dL)   Hgb urine dipstick NEGATIVE  NEGATIVE    Bilirubin Urine SMALL (*) NEGATIVE    Ketones, ur 15 (*) NEGATIVE (mg/dL)   Protein, ur NEGATIVE  NEGATIVE (mg/dL)   Urobilinogen, UA 1.0  0.0 - 1.0 (mg/dL)   Nitrite NEGATIVE  NEGATIVE    Leukocytes, UA TRACE (*) NEGATIVE   URINE MICROSCOPIC-ADD ON      Component Value Range   Squamous Epithelial / LPF RARE  RARE    WBC, UA 0-2  <3 (WBC/hpf)   Urine-Other MUCOUS PRESENT     Dg Chest 2 View  10/05/2011  *RADIOLOGY REPORT*  Clinical Data: Productive cough  CHEST - 2 VIEW  Comparison: None.  Findings: Cardiomediastinal silhouette is unremarkable. Hyperinflation is noted.  Osteopenia and mild degenerative changes thoracic spine.  There is mild compression deformity mid to lower thoracic spine of indeterminate age.  Clinical correlation is necessary.  There is a nodular density in the right upper lobe perihilar region measures 1.3 cm.  Further evaluation with CT scan of the chest is recommended.  Mild perihilar and infrahilar bronchitic changes.  Probable fibrotic changes or atelectasis left base. No segmental infiltrate or pulmonary edema.  IMPRESSION: Hyperinflation is noted.  Osteopenia and mild degenerative changes thoracic spine.  There is mild compression deformity mid to lower thoracic spine of indeterminate age.  Clinical correlation is necessary.  There is a nodular density in the right upper lobe perihilar region measures 1.3 cm.  Further evaluation with CT scan of the chest is recommended.  Mild perihilar and infrahilar bronchitic changes.  Probable fibrotic changes or  atelectasis left base. No segmental infiltrate or pulmonary edema.  Original Report Authenticated By: Natasha Mead, M.D.   Ct Chest W Contrast  10/05/2011  *RADIOLOGY REPORT*  Clinical Data: Chronic cough.  Pulmonary nodule.  History of partial lobectomy, prostatectomy.  CT CHEST WITH CONTRAST  Technique:  Multidetector CT imaging of the chest was performed following the standard protocol during bolus administration of intravenous contrast.  Contrast: 80mL OMNIPAQUE IOHEXOL 300 MG/ML  SOLN  Comparison: Chest radiographs today and abdominal CT 03/05/2011. Report from chest CT 10/08/1999 at [facility] correlated - unavailable for direct comparison.  Findings: No enlarged mediastinal or hilar lymph nodes are identified.  There is mild diffuse aortic ectasia.  There is mild diffuse atherosclerosis of the great vessels, aorta and coronary arteries for the patient's age.  Diffuse emphysematous changes are present throughout both lungs. There is biapical scarring.  Airspace opacities in both lower lobes have improved compared with the abdominal CT from 7 months ago. Corresponding with the radiographic finding is a 1.3 x 1.2 cm right upper lobe nodule.  This has slightly irregular margins, but is largely calcified.  The 2001 CT reported a 1.8 x 1.5 cm right upper lobe pulmonary nodule.  No other pulmonary nodules are seen.  Images through the upper abdomen again demonstrate a right renal calculus.  There is a mild anterior wedge deformity at T10 without definite acute features.  No acute fractures are identified.  IMPRESSION:  1.  Right upper lobe pulmonary nodule is partially calcified. Although slightly irregular in contour, this is favored to reflect a chronic granuloma or hamartoma, especially since an old CT from 2001 reported a similar finding.  Scar carcinoma can developed in a granuloma and radiographic followup is suggested for this 78 year old. 2.  Diffuse emphysema with improvement in the air space  opacities demonstrated on the prior abdominal CT. 3.  Scattered atherosclerosis. 4.  T10 compression deformity, likely nonacute.  Original Report Authenticated By: Gerrianne Scale, M.D.    1. Cough/bronchitis 2. Pulmonary nodule seen on CT image  MDM  Productive cough x 1 week.  No hemoptysis.  Afebrile.  CXR shows pulmonary nodule, will CT scan here for further eval.  Will check H&H and electrolytes panel, and UA.  IVF given. ECG shows no acute changes.  Discussed with my attending.    5:58 PM Patient has no evidence of anemia. His electrolytes all within normal limits. A chest CT was performed which shows no acute finding. There is a right upper lobe pulmonary nodule is partially calcified. This was thought to reflect a chronic granuloma or hamartoma.  Evidence of emphysema without evidence of infection. I discussed this with my attending who will reassure patient. Patient is stable to be discharged.      Fayrene Helper, PA-C 10/05/11 1805

## 2011-10-05 NOTE — ED Notes (Signed)
Pt has productive cough that started one week ago and observed large amount of green sputum while examining pt.  He has also gone for two days this week without eating.  Wife states he was very weak this am.

## 2011-10-05 NOTE — ED Provider Notes (Signed)
History     CSN: [id number]  Arrival date & time 10/05/11  1127   First MD Initiated Contact with Patient 10/05/11 1135      Chief Complaint  Patient presents with  . Cough    (Consider location/radiation/quality/duration/timing/severity/associated sxs/prior treatment) HPI Comments:  78 year old male with history of asthma bronchitis and Alzheimer's. Former smoker. Here with wife complaining of increased coughing from what is base line for him, associated with green sputum during the last few days and generalized weakness during the last week. Symptoms associated with decrease activity level. Also has not been eating solids and minimal fluid intake since yesterday. Today has not had any drinks or meals. Patient states he "feels hurting all over for few weeks". Last bowel movement with soft stools a early this morning as per wife with no blood. No hematuria.    Past Medical History  Diagnosis Date  . Alzheimer disease   . Asthma   . Bronchitis   . Cataracts, bilateral     Past Surgical History  Procedure Date  . Prostatectomy 1992  . Lung removal, partial   . Cataract extraction, bilateral     History reviewed. No pertinent family history.  History  Substance Use Topics  . Smoking status: Former Smoker -- 0.2 packs/day  . Smokeless tobacco: Not on file  . Alcohol Use: No      Review of Systems  Constitutional: Positive for activity change, appetite change and fatigue. Negative for diaphoresis.       Unknown if fever at home. Wife has not been able to check temperature.  HENT: Positive for postnasal drip. Negative for sore throat, mouth sores, trouble swallowing and voice change.   Eyes: Negative for discharge.  Respiratory: Positive for cough. Negative for choking, shortness of breath and wheezing.   Gastrointestinal: Negative for vomiting, abdominal pain, diarrhea and blood in stool.       No solid intake for 2 days. No liquid or solid intake today.  Genitourinary:  Negative for frequency and hematuria.  Musculoskeletal:       States he "hurts all over"  Skin: Negative for rash.  Neurological: Negative for seizures.  Psychiatric/Behavioral:       Decrease activity level. Spending most time in bed. Did not wanted to go to church today, feels very weak.    Allergies  Review of patient's allergies indicates no known allergies.  Home Medications   Current Outpatient Rx  Name Route Sig Dispense Refill  . DM-GUAIFENESIN ER 30-600 MG PO TB12 Oral Take 1 tablet by mouth every 12 (twelve) hours.    . DONEPEZIL HCL 10 MG PO TABS Oral Take 10 mg by mouth at bedtime as needed.    Bernadette Hoit SODIUM 8.6-50 MG PO TABS Oral Take 2 tablets by mouth 2 (two) times daily. 30 tablet 0    BP 127/73  Pulse 87  Temp(Src) 99 F (37.2 C) (Oral)  Resp 24  SpO2 94%  Physical Exam  Nursing note and vitals reviewed. Constitutional: He appears well-developed. No distress.       Very thin.  HENT:  Head: Normocephalic and atraumatic.  Right Ear: External ear normal.  Left Ear: External ear normal.  Nose: Nose normal.  Mouth/Throat: Oropharynx is clear and moist. No oropharyngeal exudate.       Moist mucous membranes. Patient wears dentures. Oropharynx is clear with no exudates. There is clear postnasal drainage. Right TM some dullness at and retraction no erythema or swelling. Left TM normal.  Eyes: Conjunctivae and EOM are normal. Pupils are equal, round, and reactive to light. Right eye exhibits no discharge. Left eye exhibits no discharge. No scleral icterus.  Neck: Neck supple. No JVD present.  Cardiovascular: Normal rate and regular rhythm.   Pulmonary/Chest:       Impress decrease breath sounds bilaterally. Poor effort. Mild tachypnea with subtle supraclavicular retractions. No wheezing rhonchi or crackles.  Abdominal: Soft. He exhibits no distension and no mass. There is no tenderness.  Lymphadenopathy:    He has no cervical adenopathy.    Neurological: He is alert.  Skin: Skin is warm. No rash noted. He is not diaphoretic.    ED Course  Procedures (including critical care time)  Labs Reviewed - No data to display Dg Chest 2 View  10/05/2011  *RADIOLOGY REPORT*  Clinical Data: Productive cough  CHEST - 2 VIEW  Comparison: None.  Findings: Cardiomediastinal silhouette is unremarkable. Hyperinflation is noted.  Osteopenia and mild degenerative changes thoracic spine.  There is mild compression deformity mid to lower thoracic spine of indeterminate age.  Clinical correlation is necessary.  There is a nodular density in the right upper lobe perihilar region measures 1.3 cm.  Further evaluation with CT scan of the chest is recommended.  Mild perihilar and infrahilar bronchitic changes.  Probable fibrotic changes or atelectasis left base. No segmental infiltrate or pulmonary edema.  IMPRESSION: Hyperinflation is noted.  Osteopenia and mild degenerative changes thoracic spine.  There is mild compression deformity mid to lower thoracic spine of indeterminate age.  Clinical correlation is necessary.  There is a nodular density in the right upper lobe perihilar region measures 1.3 cm.  Further evaluation with CT scan of the chest is recommended.  Mild perihilar and infrahilar bronchitic changes.  Probable fibrotic changes or atelectasis left base. No segmental infiltrate or pulmonary edema.  Original Report Authenticated By: Natasha Mead, M.D.     1. Bronchitis   2. Physical deconditioning       MDM  EKG: Sinus rhythm with ventricular rate 72 no ST or acute ischemic changes. No blocks.  Impress bronchitis but concerned about deconditioning also patient not eating or drinking today at risk for dehydration.  Decided to transfer to the emergency department for further evaluation and management. No blood test has been performed at [facility] . Condition at discharge: Stable          Sharin Grave, MD 10/06/11 1154

## 2011-10-05 NOTE — ED Notes (Signed)
EKG performed; presented to Dr. Alfonse Ras at pt bedside; no previous tracing requested by MD at this time.

## 2011-10-05 NOTE — ED Notes (Signed)
[facility] for productive cough x 1 week, having generalized weakness, not eating. Had xray done at [facility] and it showed right lung density and sent here for ct scan.

## 2011-10-05 NOTE — Discharge Instructions (Signed)
You were found to have a pulmonary nodule in your lung on chest CT.  This is likely a chronic finding.  You may have a follow up CT as per recommendation from your doctor if needed.  Take cough medication as prescribed.  Return if you have any other concerns.    Cough, Adult  A cough is a reflex. It helps you clear your throat and airways. A cough can help heal your body. A cough can last 2 or 3 weeks (acute) or may last more than 8 weeks (chronic). Some common causes of a cough can include an infection, allergy, or a cold. HOME CARE  Only take medicine as told by your doctor.   If given, take your medicines (antibiotics) as told. Finish them even if you start to feel better.   Use a cold steam vaporizer or humidier in your home. This can help loosen thick spit (secretions).   Sleep so you are almost sitting up (semi-upright). Use pillows to do this. This helps reduce coughing.   Rest as needed.   Stop smoking if you smoke.  GET HELP RIGHT AWAY IF:  You have yellowish-white fluid (pus) in your thick spit.   Your cough gets worse.   Your medicine does not reduce coughing, and you are losing sleep.   You cough up blood.   You have trouble breathing.   Your pain gets worse and medicine does not help.   You have a fever.  MAKE SURE YOU:   Understand these instructions.   Will watch your condition.   Will get help right away if you are not doing well or get worse.  Document Released: 12/26/2010 Document Revised: 04/03/2011 Document Reviewed: 12/26/2010 [facility] Patient Information 2012 [place], [place].

## 2011-10-05 NOTE — ED Notes (Signed)
Patient denies pain and is resting comfortably.  

## 2011-10-07 NOTE — ED Provider Notes (Signed)
Medical screening examination/treatment/procedure(s) were conducted as a shared visit with non-physician practitioner(s) and myself.  I personally evaluated the patient during the encounter 77 yo man with cough, COPD seen on x-ray had pulmonary nodule that on CT appeared benign and was like one seen on CT in 2001.  Reassured and released on symptomatic treatment.  Carleene Cooper III, MD 10/07/11 [phone]

## 2012-06-30 ENCOUNTER — Encounter (HOSPITAL_COMMUNITY): Payer: Self-pay | Admitting: *Deleted

## 2012-06-30 ENCOUNTER — Emergency Department (HOSPITAL_COMMUNITY): Payer: Medicare Other

## 2012-06-30 ENCOUNTER — Emergency Department (HOSPITAL_COMMUNITY)
Admission: EM | Admit: 2012-06-30 | Discharge: 2012-07-01 | Disposition: A | Discharge status: Home / Self Care | Payer: Medicare Other | Attending: Emergency Medicine | Admitting: Emergency Medicine

## 2012-06-30 DIAGNOSIS — S0190XA Unspecified open wound of unspecified part of head, initial encounter: Secondary | ICD-10-CM | POA: Insufficient documentation

## 2012-06-30 DIAGNOSIS — Y939 Activity, unspecified: Secondary | ICD-10-CM | POA: Insufficient documentation

## 2012-06-30 DIAGNOSIS — Y92009 Unspecified place in unspecified non-institutional (private) residence as the place of occurrence of the external cause: Secondary | ICD-10-CM | POA: Insufficient documentation

## 2012-06-30 DIAGNOSIS — Y9289 Other specified places as the place of occurrence of the external cause: Secondary | ICD-10-CM | POA: Insufficient documentation

## 2012-06-30 DIAGNOSIS — Z79899 Other long term (current) drug therapy: Secondary | ICD-10-CM | POA: Insufficient documentation

## 2012-06-30 DIAGNOSIS — J209 Acute bronchitis, unspecified: Secondary | ICD-10-CM | POA: Insufficient documentation

## 2012-06-30 DIAGNOSIS — R059 Cough, unspecified: Secondary | ICD-10-CM | POA: Insufficient documentation

## 2012-06-30 DIAGNOSIS — W1809XA Striking against other object with subsequent fall, initial encounter: Secondary | ICD-10-CM | POA: Insufficient documentation

## 2012-06-30 DIAGNOSIS — Z87891 Personal history of nicotine dependence: Secondary | ICD-10-CM | POA: Insufficient documentation

## 2012-06-30 DIAGNOSIS — G309 Alzheimer's disease, unspecified: Secondary | ICD-10-CM | POA: Insufficient documentation

## 2012-06-30 DIAGNOSIS — Z8669 Personal history of other diseases of the nervous system and sense organs: Secondary | ICD-10-CM | POA: Insufficient documentation

## 2012-06-30 DIAGNOSIS — R05 Cough: Secondary | ICD-10-CM | POA: Insufficient documentation

## 2012-06-30 DIAGNOSIS — IMO0002 Reserved for concepts with insufficient information to code with codable children: Secondary | ICD-10-CM | POA: Insufficient documentation

## 2012-06-30 DIAGNOSIS — F0280 Dementia in other diseases classified elsewhere without behavioral disturbance: Secondary | ICD-10-CM | POA: Insufficient documentation

## 2012-06-30 DIAGNOSIS — J40 Bronchitis, not specified as acute or chronic: Secondary | ICD-10-CM

## 2012-06-30 DIAGNOSIS — S0191XA Laceration without foreign body of unspecified part of head, initial encounter: Secondary | ICD-10-CM

## 2012-06-30 DIAGNOSIS — W19XXXA Unspecified fall, initial encounter: Secondary | ICD-10-CM

## 2012-06-30 DIAGNOSIS — J45909 Unspecified asthma, uncomplicated: Secondary | ICD-10-CM | POA: Insufficient documentation

## 2012-06-30 DIAGNOSIS — R509 Fever, unspecified: Secondary | ICD-10-CM

## 2012-06-30 HISTORY — DX: Alzheimer's disease, unspecified: G30.9

## 2012-06-30 HISTORY — DX: Dementia in other diseases classified elsewhere, unspecified severity, without behavioral disturbance, psychotic disturbance, mood disturbance, and anxiety: F02.80

## 2012-06-30 LAB — BASIC METABOLIC PANEL
CO2: 25 mEq/L (ref 19–32)
Calcium: 8.6 mg/dL (ref 8.4–10.5)
Glucose, Bld: 110 mg/dL — ABNORMAL HIGH (ref 70–99)
Sodium: 137 mEq/L (ref 135–145)

## 2012-06-30 LAB — CBC WITH DIFFERENTIAL/PLATELET
Eosinophils Relative: 0 % (ref 0–5)
HCT: 36.7 % — ABNORMAL LOW (ref 39.0–52.0)
Hemoglobin: 12.4 g/dL — ABNORMAL LOW (ref 13.0–17.0)
Lymphocytes Relative: 5 % — ABNORMAL LOW (ref 12–46)
Lymphs Abs: 0.8 10*3/uL (ref 0.7–4.0)
MCV: 90.4 fL (ref 78.0–100.0)
Monocytes Absolute: 0.8 10*3/uL (ref 0.1–1.0)
Monocytes Relative: 5 % (ref 3–12)
Platelets: 207 10*3/uL (ref 150–400)
RBC: 4.06 MIL/uL — ABNORMAL LOW (ref 4.22–5.81)
WBC: 16.3 10*3/uL — ABNORMAL HIGH (ref 4.0–10.5)

## 2012-06-30 LAB — POCT I-STAT TROPONIN I

## 2012-06-30 MED ORDER — AZITHROMYCIN 250 MG PO TABS
500.0000 mg | ORAL_TABLET | Freq: Once | ORAL | Status: AC
  Administered 2012-07-01: 500 mg via ORAL
  Filled 2012-06-30: qty 2

## 2012-06-30 NOTE — ED Notes (Signed)
Pt to radiology at this time.

## 2012-06-30 NOTE — ED Notes (Signed)
Per family pt fell around 1830 hitting back of head; abrasion noted behind right ear; pt found by family on floor in bathroom; hit mouth; neg loc; unsure what made patient fall initially; per family states pt has history of falls; went to bed and woke up c/o chest and back pain

## 2012-06-30 NOTE — ED Notes (Signed)
Pt poor historian r/t hc of Alzheimer's disease.  Pt unable to answer many questions other than stating pain to chest.  Per pt's wife, pt fell at 1800 and hit the back of his head and his mouth. Bleeding controlled to 2cm lac behind right ear.  Pt went to sleep at 1830 after taking Aleve for pain.  Pt awoke at 2200 c/o chest and back pain.

## 2012-06-30 NOTE — ED Provider Notes (Addendum)
History     CSN: [id number]  Arrival date & time 06/30/12  2200   First MD Initiated Contact with Patient 06/30/12 2302      Chief Complaint  Patient presents with  . Fall  . Chest Pain    (Consider location/radiation/quality/duration/timing/severity/associated sxs/prior treatment) HPI 79 year old male presents emergency department from home with complaint of fall earlier in the evening, and chest back pain and head pain. Patient has dementia and is not a reliable historian. Wife reports he fell in the bathroom around 6 PM. He has abrasion to scalp behind right ear. Patient did not appear to have LOC, but wife reports fall was unwitnessed. Patient went to bed soon after the fall. He woke up just prior to arrival complaining of pain. Patient has chronic asthma and bronchitis with cough. He has been placed on Mucinex as primary care Dr. Denton Lank reports he's had a normal day, aside from having decreased appetite. Patient did have low-grade fever here today, wife denies any fevers prior to arrival. She reports his cough is no worse than normal. There's been no complaint of abdominal pain. She denies any foul smell to his urine.   Past Medical History  Diagnosis Date  . Alzheimer disease   . Asthma   . Bronchitis   . Cataracts, bilateral   . Alzheimer's dementia     Past Surgical History  Procedure Laterality Date  . Prostatectomy  1992  . Lung removal, partial    . Cataract extraction, bilateral      No family history on file.  History  Substance Use Topics  . Smoking status: Former Smoker -- 0.25 packs/day  . Smokeless tobacco: Not on file  . Alcohol Use: No      Review of Systems  Unable to perform ROS: Dementia    Allergies  Review of patient's allergies indicates no known allergies.  Home Medications   Current Outpatient Rx  Name  Route  Sig  Dispense  Refill  . dextromethorphan-guaiFENesin (MUCINEX DM) 30-600 MG per 12 hr tablet   Oral   Take 1 tablet by mouth  every 12 (twelve) hours.         Marland Kitchen donepezil (ARICEPT) 10 MG tablet   Oral   Take 10 mg by mouth at bedtime.            BP 130/74  Pulse 97  Temp(Src) 100.4 F (38 C) (Oral)  Resp 18  SpO2 100%  Physical Exam  Nursing note and vitals reviewed. Constitutional: He appears well-developed and well-nourished. No distress.  HENT:  Head: Normocephalic.  Right Ear: External ear normal.  Left Ear: External ear normal.  Nose: Nose normal.  Mouth/Throat: Oropharynx is clear and moist. No oropharyngeal exudate.  Abrasion versus laceration behind right ear.  Eyes: Conjunctivae and EOM are normal. Pupils are equal, round, and reactive to light.  Neck: Normal range of motion. Neck supple. No JVD present. No tracheal deviation present. No thyromegaly present.  Cardiovascular: Normal rate, regular rhythm, normal heart sounds and intact distal pulses.  Exam reveals no gallop and no friction rub.   No murmur heard. Pulmonary/Chest: No stridor. He has no wheezes. He has no rales. He exhibits no tenderness.  Coarse cough  Abdominal: Soft. Bowel sounds are normal. He exhibits no distension and no mass. There is no tenderness. There is no rebound and no guarding.  Musculoskeletal: Normal range of motion. He exhibits no edema and no tenderness.  Lymphadenopathy:    He has no cervical  adenopathy.  Neurological: He is alert. He exhibits normal muscle tone. Coordination normal.  Skin: Skin is warm and dry. No rash noted. He is not diaphoretic. No erythema. No pallor.    ED Course  Procedures (including critical care time)  Labs Reviewed  CBC WITH DIFFERENTIAL - Abnormal; Notable for the following:    WBC 16.3 (*)    RBC 4.06 (*)    Hemoglobin 12.4 (*)    HCT 36.7 (*)    Neutrophils Relative 91 (*)    Neutro Abs 14.8 (*)    Lymphocytes Relative 5 (*)    All other components within normal limits  BASIC METABOLIC PANEL - Abnormal; Notable for the following:    Glucose, Bld 110 (*)    GFR  calc non Af Amer 71 (*)    GFR calc Af Amer 82 (*)    All other components within normal limits  URINALYSIS, ROUTINE W REFLEX MICROSCOPIC  POCT I-STAT TROPONIN I   Dg Chest 2 View  06/30/2012  *RADIOLOGY REPORT*  Clinical Data: Fall of right chest pain.  CHEST - 2 VIEW  Comparison: 11/18/2011.  Findings: No evidence of acute contusion, effusion or pneumothorax. The heart and mediastinal structures are normal.  Stable calcified granuloma in the right upper lobe.  Stable increased interstitial changes at the bases worse on the  left.  There may be some chronic interstitial disease in the left  lung base.  There are no acute bony changes.  IMPRESSION: No evidence of acute trauma.  Chronic pulmonary disease.   Original Report Authenticated By: Sander Radon, M.D.    Dg Thoracic Spine 2 View  07/01/2012  *RADIOLOGY REPORT*  Clinical Data: Status post fall; mid back pain.  THORACIC SPINE - 2 VIEW  Comparison: Chest radiograph performed 11/18/2011, and CT of the chest performed 10/05/2011  Findings: There is no evidence of acute fracture or subluxation. Mild loss of height is again noted at vertebral body T10, stable from prior studies.  Vertebral bodies demonstrate normal alignment. Intervertebral disc spaces are preserved.  Chronic dense left basilar airspace opacification is again noted. The mediastinum is unremarkable in appearance.  IMPRESSION: No evidence of acute fracture or subluxation along the thoracic spine; stable mild loss of height at T10.   Original Report Authenticated By: Tonia Ghent, M.D.    Ct Head Wo Contrast  06/30/2012  *RADIOLOGY REPORT*  Clinical Data:  Status post fall; laceration behind the right ear. Concern for head or cervical spine injury.  CT HEAD WITHOUT CONTRAST AND CT CERVICAL SPINE WITHOUT CONTRAST  Technique:  Multidetector CT imaging of the head and cervical spine was performed following the standard protocol without intravenous contrast.  Multiplanar CT image  reconstructions of the cervical spine were also generated.  Comparison: CT of the head performed 02/15/2010  CT HEAD  Findings: There is no evidence of acute infarction, mass lesion, or intra- or extra-axial hemorrhage on CT.  Prominence of the ventricles and sulci reflects mild to moderate cortical volume loss.  Diffuse periventricular and subcortical white matter change likely reflects small vessel ischemic microangiopathy.  Cerebellar atrophy is noted.  The brainstem and fourth ventricle are within normal limits.  The basal ganglia are unremarkable in appearance.  The cerebral hemispheres demonstrate grossly normal gray-white differentiation. No mass effect or midline shift is seen.  There is no evidence of fracture; visualized osseous structures are unremarkable in appearance.  The orbits are within normal limits. There is partial opacification of the right side of the  sphenoid sinus.  The remaining paranasal sinuses and mastoid air cells are well-aerated.  No significant soft tissue abnormalities are seen.  IMPRESSION:  1.  No evidence of traumatic intracranial injury or fracture. 2.  Mild to moderate cortical volume loss and diffuse small vessel ischemic microangiopathy. 3.  Partial opacification of the right side of the sphenoid sinus.  CT CERVICAL SPINE  Findings: There is no evidence of fracture or subluxation. Vertebral bodies demonstrate normal height and alignment.  There is mild multilevel disc space narrowing along the cervical spine, with scattered anterior and posterior disc osteophyte complexes.  Mild degenerative change is noted about the dens.  Prevertebral soft tissues are within normal limits.  The visualized portions of the thyroid gland are unremarkable in appearance.  Significant emphysema and scarring are noted at the partially visualized lung apices.  No significant soft tissue abnormalities are seen.  IMPRESSION:  1.  No evidence of fracture or subluxation along the cervical spine. 2.   Mild degenerative change noted along the cervical spine. 3.  Significant emphysema and scarring at the partially visualized lung apices.   Original Report Authenticated By: Tonia Ghent, M.D.    Ct Cervical Spine Wo Contrast  06/30/2012  *RADIOLOGY REPORT*  Clinical Data:  Status post fall; laceration behind the right ear. Concern for head or cervical spine injury.  CT HEAD WITHOUT CONTRAST AND CT CERVICAL SPINE WITHOUT CONTRAST  Technique:  Multidetector CT imaging of the head and cervical spine was performed following the standard protocol without intravenous contrast.  Multiplanar CT image reconstructions of the cervical spine were also generated.  Comparison: CT of the head performed 02/15/2010  CT HEAD  Findings: There is no evidence of acute infarction, mass lesion, or intra- or extra-axial hemorrhage on CT.  Prominence of the ventricles and sulci reflects mild to moderate cortical volume loss.  Diffuse periventricular and subcortical white matter change likely reflects small vessel ischemic microangiopathy.  Cerebellar atrophy is noted.  The brainstem and fourth ventricle are within normal limits.  The basal ganglia are unremarkable in appearance.  The cerebral hemispheres demonstrate grossly normal gray-white differentiation. No mass effect or midline shift is seen.  There is no evidence of fracture; visualized osseous structures are unremarkable in appearance.  The orbits are within normal limits. There is partial opacification of the right side of the sphenoid sinus.  The remaining paranasal sinuses and mastoid air cells are well-aerated.  No significant soft tissue abnormalities are seen.  IMPRESSION:  1.  No evidence of traumatic intracranial injury or fracture. 2.  Mild to moderate cortical volume loss and diffuse small vessel ischemic microangiopathy. 3.  Partial opacification of the right side of the sphenoid sinus.  CT CERVICAL SPINE  Findings: There is no evidence of fracture or subluxation.  Vertebral bodies demonstrate normal height and alignment.  There is mild multilevel disc space narrowing along the cervical spine, with scattered anterior and posterior disc osteophyte complexes.  Mild degenerative change is noted about the dens.  Prevertebral soft tissues are within normal limits.  The visualized portions of the thyroid gland are unremarkable in appearance.  Significant emphysema and scarring are noted at the partially visualized lung apices.  No significant soft tissue abnormalities are seen.  IMPRESSION:  1.  No evidence of fracture or subluxation along the cervical spine. 2.  Mild degenerative change noted along the cervical spine. 3.  Significant emphysema and scarring at the partially visualized lung apices.   Original Report Authenticated By: Tonia Ghent, M.D.  Date: 06/30/2012  Rate: 82  Rhythm: normal sinus rhythm  QRS Axis: left  Intervals: normal  ST/T Wave abnormalities: normal  Conduction Disutrbances:none  Narrative Interpretation: LVH  Old EKG Reviewed: none available    1. Fall at home, initial encounter   2. Laceration of head, initial encounter   3. Fever   4. Bronchitis       MDM  79 year old male status post fall. Patient noted to have leukocytosis along with fever. Suspect worsening of his bronchitis. Will treat with Zithromax. We'll check urine as well for possible infection. Patient have CT of head and C-spine, and thoracic spine given complaint of pain. Do not feel symptoms are due to ACS, PE or rib fracture   1:22 AM Wound closed with Dermabond. Patient unable to urinate here in the ED. Will have him followup with his primary care Dr. on Friday for recheck. Do not feel patient requires and out cath at this time, concern for introducing infection. Wife instructed to f/u on Friday.         Olivia Mackie, MD 07/01/12 7829  Olivia Mackie, MD 07/01/12 [phone]

## 2012-07-01 MED ORDER — AZITHROMYCIN 250 MG PO TABS: 250.0000 mg | ORAL_TABLET | Freq: Every day | ORAL | Status: DC

## 2012-07-01 NOTE — ED Notes (Signed)
Dermabond being completed at this time.

## 2012-09-24 ENCOUNTER — Emergency Department (HOSPITAL_COMMUNITY)
Admission: EM | Admit: 2012-09-24 | Discharge: 2012-09-24 | Disposition: A | Discharge status: Home / Self Care | Payer: Federal, State, Local not specified - PPO | Attending: Emergency Medicine | Admitting: Emergency Medicine

## 2012-09-24 ENCOUNTER — Emergency Department (HOSPITAL_COMMUNITY): Payer: Federal, State, Local not specified - PPO

## 2012-09-24 ENCOUNTER — Encounter (HOSPITAL_COMMUNITY): Payer: Self-pay

## 2012-09-24 DIAGNOSIS — R509 Fever, unspecified: Secondary | ICD-10-CM | POA: Insufficient documentation

## 2012-09-24 DIAGNOSIS — G309 Alzheimer's disease, unspecified: Secondary | ICD-10-CM | POA: Insufficient documentation

## 2012-09-24 DIAGNOSIS — R531 Weakness: Secondary | ICD-10-CM

## 2012-09-24 DIAGNOSIS — Z87891 Personal history of nicotine dependence: Secondary | ICD-10-CM | POA: Insufficient documentation

## 2012-09-24 DIAGNOSIS — R5381 Other malaise: Secondary | ICD-10-CM | POA: Insufficient documentation

## 2012-09-24 DIAGNOSIS — Z8669 Personal history of other diseases of the nervous system and sense organs: Secondary | ICD-10-CM | POA: Insufficient documentation

## 2012-09-24 DIAGNOSIS — Z9849 Cataract extraction status, unspecified eye: Secondary | ICD-10-CM | POA: Insufficient documentation

## 2012-09-24 DIAGNOSIS — F0280 Dementia in other diseases classified elsewhere without behavioral disturbance: Secondary | ICD-10-CM | POA: Insufficient documentation

## 2012-09-24 DIAGNOSIS — R000 Tachycardia, unspecified: Secondary | ICD-10-CM | POA: Insufficient documentation

## 2012-09-24 DIAGNOSIS — R059 Cough, unspecified: Secondary | ICD-10-CM | POA: Insufficient documentation

## 2012-09-24 DIAGNOSIS — Z79899 Other long term (current) drug therapy: Secondary | ICD-10-CM | POA: Insufficient documentation

## 2012-09-24 DIAGNOSIS — Z880 Allergy status to penicillin: Secondary | ICD-10-CM | POA: Insufficient documentation

## 2012-09-24 DIAGNOSIS — J45909 Unspecified asthma, uncomplicated: Secondary | ICD-10-CM | POA: Insufficient documentation

## 2012-09-24 DIAGNOSIS — R05 Cough: Secondary | ICD-10-CM | POA: Insufficient documentation

## 2012-09-24 LAB — URINALYSIS, ROUTINE W REFLEX MICROSCOPIC
Bilirubin Urine: NEGATIVE
Glucose, UA: NEGATIVE mg/dL
Hgb urine dipstick: NEGATIVE
Ketones, ur: NEGATIVE mg/dL
Leukocytes, UA: NEGATIVE
Nitrite: NEGATIVE
Protein, ur: NEGATIVE mg/dL
Specific Gravity, Urine: 1.02 (ref 1.005–1.030)
Urobilinogen, UA: 1 mg/dL (ref 0.0–1.0)
pH: 7.5 (ref 5.0–8.0)

## 2012-09-24 LAB — CBC WITH DIFFERENTIAL/PLATELET
Basophils Absolute: 0 10*3/uL (ref 0.0–0.1)
Basophils Relative: 0 % (ref 0–1)
Eosinophils Absolute: 0 10*3/uL (ref 0.0–0.7)
Eosinophils Relative: 0 % (ref 0–5)
HCT: 36.2 % — ABNORMAL LOW (ref 39.0–52.0)
Hemoglobin: 12 g/dL — ABNORMAL LOW (ref 13.0–17.0)
Lymphocytes Relative: 5 % — ABNORMAL LOW (ref 12–46)
Lymphs Abs: 0.7 10*3/uL (ref 0.7–4.0)
MCH: 29.8 pg (ref 26.0–34.0)
MCHC: 33.1 g/dL (ref 30.0–36.0)
MCV: 89.8 fL (ref 78.0–100.0)
Monocytes Absolute: 0.7 10*3/uL (ref 0.1–1.0)
Monocytes Relative: 5 % (ref 3–12)
Neutro Abs: 11.8 10*3/uL — ABNORMAL HIGH (ref 1.7–7.7)
Neutrophils Relative %: 90 % — ABNORMAL HIGH (ref 43–77)
Platelets: ADEQUATE 10*3/uL (ref 150–400)
RBC: 4.03 MIL/uL — ABNORMAL LOW (ref 4.22–5.81)
RDW: 15.1 % (ref 11.5–15.5)
WBC: 13.2 10*3/uL — ABNORMAL HIGH (ref 4.0–10.5)

## 2012-09-24 LAB — COMPREHENSIVE METABOLIC PANEL
ALT: 7 U/L (ref 0–53)
Alkaline Phosphatase: 84 U/L (ref 39–117)
BUN: 15 mg/dL (ref 6–23)
CO2: 28 mEq/L (ref 19–32)
GFR calc Af Amer: >90 mL/min (ref >90)
GFR calc non Af Amer: 79 mL/min — ABNORMAL LOW (ref >90)
Glucose, Bld: 94 mg/dL (ref 70–99)
Potassium: 4.6 mEq/L (ref 3.5–5.1)
Sodium: 137 mEq/L (ref 135–145)

## 2012-09-24 MED ORDER — SODIUM CHLORIDE 0.9 % IV BOLUS (SEPSIS)
500.0000 mL | Freq: Once | INTRAVENOUS | Status: AC
  Administered 2012-09-24: 500 mL via INTRAVENOUS

## 2012-09-24 MED ORDER — LORAZEPAM 2 MG/ML IJ SOLN
0.5000 mg | Freq: Once | INTRAMUSCULAR | Status: DC
  Filled 2012-09-24: qty 1

## 2012-09-24 MED ORDER — ALPRAZOLAM 0.5 MG PO TABS
1.0000 mg | ORAL_TABLET | Freq: Once | ORAL | Status: DC
  Filled 2012-09-24: qty 1

## 2012-09-24 NOTE — ED Notes (Signed)
Pt notified that urine is needed.  Urinal left at bedside and pt instructed not to walk to restroom

## 2012-09-24 NOTE — ED Provider Notes (Addendum)
History     CSN: [id number]  Arrival date & time 09/24/12  7829   First MD Initiated Contact with Patient 09/24/12 1935      Chief Complaint  Patient presents with  . Fever    (Consider location/radiation/quality/duration/timing/severity/associated sxs/prior treatment) The history is provided by the spouse. The history is limited by the condition of the patient.  Eliah Marquard is a 79 y.o. male hx of alzheimers, asthma here with weakness and fever and tachycardia. He is demented and unable to give a history. As per wife, he is more weak today and doesn't want to get out of bed. He also has been coughing. She took his temperature and it was 101 F at home with pulse 140s. She called his doctor's nurse, who told her to bring him to the ER for evaluation. Denies abdominal pain or vomiting or diarrhea.    Level V caveat- dementia   Past Medical History  Diagnosis Date  . Alzheimer disease   . Asthma   . Bronchitis   . Cataracts, bilateral   . Alzheimer's dementia     Past Surgical History  Procedure Laterality Date  . Prostatectomy  1992  . Lung removal, partial    . Cataract extraction, bilateral      History reviewed. No pertinent family history.  History  Substance Use Topics  . Smoking status: Former Smoker -- 0.25 packs/day  . Smokeless tobacco: Not on file  . Alcohol Use: No      Review of Systems  Unable to perform ROS: Dementia  Constitutional: Positive for fever.    Allergies  Penicillins; Droperidol; and Toradol  Home Medications   Current Outpatient Rx  Name  Route  Sig  Dispense  Refill  . ALPRAZolam (XANAX) 1 MG tablet   Oral   Take 1 mg by mouth 3 (three) times daily as needed for sleep or anxiety.         Marland Kitchen dextromethorphan-guaiFENesin (MUCINEX DM) 30-600 MG per 12 hr tablet   Oral   Take 1 tablet by mouth every 12 (twelve) hours.         Marland Kitchen donepezil (ARICEPT) 10 MG tablet   Oral   Take 10 mg by mouth at bedtime.          .  ferrous sulfate 325 (65 FE) MG tablet   Oral   Take 325 mg by mouth at bedtime.         . folic acid (FOLVITE) 1 MG tablet   Oral   Take 1 mg by mouth daily.         . furosemide (LASIX) 80 MG tablet   Oral   Take 80 mg by mouth 2 (two) times daily.         Marland Kitchen lactulose, encephalopathy, (CHRONULAC) 10 GM/15ML SOLN   Oral   Take 30 g by mouth 5 (five) times daily.         Marland Kitchen levETIRAcetam (KEPPRA) 500 MG tablet   Oral   Take 500-1,000 mg by mouth 2 (two) times daily. 1000 mg in the morning and 500 mg at bedtime         . levothyroxine (SYNTHROID, LEVOTHROID) 75 MCG tablet   Oral   Take 75 mcg by mouth daily before breakfast.         . Multiple Vitamin (MULTIVITAMIN WITH MINERALS) TABS   Oral   Take 1 tablet by mouth daily.         Marland Kitchen omeprazole (PRILOSEC) 20  MG capsule   Oral   Take 20 mg by mouth daily.         Marland Kitchen oxycodone (OXY-IR) 5 MG capsule   Oral   Take 10 mg by mouth every 4 (four) hours as needed for pain.         . potassium chloride (K-DUR,KLOR-CON) 10 MEQ tablet   Oral   Take 10 mEq by mouth daily.         . rifaximin (XIFAXAN) 550 MG TABS   Oral   Take 550 mg by mouth 2 (two) times daily.         . risperiDONE (RISPERDAL) 0.25 MG tablet   Oral   Take 0.25 mg by mouth 2 (two) times daily.         Marland Kitchen spironolactone (ALDACTONE) 100 MG tablet   Oral   Take 200 mg by mouth daily.           BP 130/66  Pulse 82  Temp(Src) 98.8 F (37.1 C) (Oral)  Resp 14  SpO2 90%  Physical Exam  Nursing note and vitals reviewed. Constitutional:  Old, demented, NAD.   HENT:  Head: Normocephalic and atraumatic.  MM slightly dry   Eyes: Conjunctivae are normal. Pupils are equal, round, and reactive to light.  Neck: Normal range of motion. Neck supple.  Cardiovascular: Normal rate, regular rhythm and normal heart sounds.   Pulmonary/Chest: Effort normal.  Dec breath sounds bilateral bases   Abdominal: Soft. Bowel sounds are normal. He  exhibits no distension. There is no tenderness. There is no rebound and no guarding.  Musculoskeletal: Normal range of motion.  Neurological: He is alert.  Demented, moving all extremities   Skin: Skin is warm and dry.  Psychiatric:  Unable     ED Course  Procedures (including critical care time)  Labs Reviewed  CBC WITH DIFFERENTIAL - Abnormal; Notable for the following:    WBC 13.2 (*)    RBC 4.03 (*)    Hemoglobin 12.0 (*)    HCT 36.2 (*)    Neutrophils Relative % 90 (*)    Lymphocytes Relative 5 (*)    Neutro Abs 11.8 (*)    All other components within normal limits  COMPREHENSIVE METABOLIC PANEL - Abnormal; Notable for the following:    Albumin 3.0 (*)    Total Bilirubin 0.2 (*)    GFR calc non Af Amer 79 (*)    All other components within normal limits  CULTURE, BLOOD (ROUTINE X 2)  CULTURE, BLOOD (ROUTINE X 2)  URINE CULTURE  LACTIC ACID, PLASMA  URINALYSIS, ROUTINE W REFLEX MICROSCOPIC   Dg Chest 2 View  09/24/2012   *RADIOLOGY REPORT*  Clinical Data: Fever. Demented.  CHEST - 2 VIEW  Comparison: 06/30/2012  Findings: Hyperinflation.  Chronic mild compression deformities in the thoracic spine.  Patient rotated left.  Mild cardiomegaly. Lower lobe predominant interstitial thickening.  Suspect a central right upper lobe pulmonary nodule, similar back to 10/05/2011.  No lobar consolidation.  IMPRESSION: No evidence of pneumonia.  Hyperinflation and chronic interstitial thickening.  Right upper lobe lung nodule.  See 10/05/2011 CT.   Original Report Authenticated By: Jeronimo Greaves, M.D.     No diagnosis found.    MDM  Greyden Besecker is a 79 y.o. male here with fever. Afebrile here. Will need to r/o sepsis with labs, CXR, UA. Appears dehydrated so will hydrate in the ED. No falls and nonfocal neuro exam so CT head not necessary.   10:41  PM WBC slightly elevated. CXR showed no pneumonia. UA nl. He is not more altered and I am not concerned for meningitis. Likely viral  syndrome. Lactate nl. Blood culture sent. I gave the wife option of admission for obs vs observing at home. Wife said that he gets agitated in the hospital and would like to observe him at home. Return precautions given to family.         Richardean Canal, MD 09/24/12 2244  Richardean Canal, MD 09/24/12 2245

## 2012-09-24 NOTE — ED Notes (Signed)
Family reports that pt had a fever of 101 and a pulse of 140. Call family dr and was told to come in. Pt calm and alert. VS stable.

## 2012-09-24 NOTE — ED Notes (Signed)
Pt able to produce so no in and out required

## 2012-09-26 LAB — URINE CULTURE

## 2012-10-01 LAB — CULTURE, BLOOD (ROUTINE X 2): Culture: NO GROWTH

## 2013-04-30 ENCOUNTER — Inpatient Hospital Stay (HOSPITAL_COMMUNITY)
Admission: EM | Admit: 2013-04-30 | Discharge: 2013-05-05 | DRG: 194 | Disposition: A | Discharge status: Skilled Nursing Facility | Payer: Medicare Other | Attending: Internal Medicine | Admitting: Internal Medicine

## 2013-04-30 ENCOUNTER — Emergency Department (HOSPITAL_COMMUNITY): Payer: Medicare Other

## 2013-04-30 ENCOUNTER — Encounter (HOSPITAL_COMMUNITY): Payer: Self-pay | Admitting: Emergency Medicine

## 2013-04-30 DIAGNOSIS — D6489 Other specified anemias: Secondary | ICD-10-CM | POA: Diagnosis present

## 2013-04-30 DIAGNOSIS — I509 Heart failure, unspecified: Secondary | ICD-10-CM | POA: Diagnosis present

## 2013-04-30 DIAGNOSIS — E876 Hypokalemia: Secondary | ICD-10-CM | POA: Diagnosis present

## 2013-04-30 DIAGNOSIS — I2789 Other specified pulmonary heart diseases: Secondary | ICD-10-CM | POA: Diagnosis present

## 2013-04-30 DIAGNOSIS — I10 Essential (primary) hypertension: Secondary | ICD-10-CM

## 2013-04-30 DIAGNOSIS — Z87891 Personal history of nicotine dependence: Secondary | ICD-10-CM

## 2013-04-30 DIAGNOSIS — N19 Unspecified kidney failure: Secondary | ICD-10-CM | POA: Diagnosis present

## 2013-04-30 DIAGNOSIS — R0902 Hypoxemia: Secondary | ICD-10-CM | POA: Diagnosis present

## 2013-04-30 DIAGNOSIS — D649 Anemia, unspecified: Secondary | ICD-10-CM | POA: Diagnosis present

## 2013-04-30 DIAGNOSIS — J210 Acute bronchiolitis due to respiratory syncytial virus: Secondary | ICD-10-CM

## 2013-04-30 DIAGNOSIS — J449 Chronic obstructive pulmonary disease, unspecified: Secondary | ICD-10-CM | POA: Diagnosis present

## 2013-04-30 DIAGNOSIS — I5032 Chronic diastolic (congestive) heart failure: Secondary | ICD-10-CM | POA: Diagnosis present

## 2013-04-30 DIAGNOSIS — Z79899 Other long term (current) drug therapy: Secondary | ICD-10-CM

## 2013-04-30 DIAGNOSIS — Z23 Encounter for immunization: Secondary | ICD-10-CM

## 2013-04-30 DIAGNOSIS — Z781 Physical restraint status: Secondary | ICD-10-CM | POA: Diagnosis not present

## 2013-04-30 DIAGNOSIS — F02818 Dementia in other diseases classified elsewhere, unspecified severity, with other behavioral disturbance: Secondary | ICD-10-CM | POA: Diagnosis present

## 2013-04-30 DIAGNOSIS — J189 Pneumonia, unspecified organism: Principal | ICD-10-CM | POA: Diagnosis present

## 2013-04-30 DIAGNOSIS — J4489 Other specified chronic obstructive pulmonary disease: Secondary | ICD-10-CM

## 2013-04-30 DIAGNOSIS — Z8701 Personal history of pneumonia (recurrent): Secondary | ICD-10-CM

## 2013-04-30 DIAGNOSIS — I5030 Unspecified diastolic (congestive) heart failure: Secondary | ICD-10-CM | POA: Diagnosis present

## 2013-04-30 DIAGNOSIS — F0281 Dementia in other diseases classified elsewhere with behavioral disturbance: Secondary | ICD-10-CM | POA: Diagnosis present

## 2013-04-30 DIAGNOSIS — Z880 Allergy status to penicillin: Secondary | ICD-10-CM

## 2013-04-30 DIAGNOSIS — F0280 Dementia in other diseases classified elsewhere without behavioral disturbance: Secondary | ICD-10-CM | POA: Diagnosis present

## 2013-04-30 DIAGNOSIS — I272 Pulmonary hypertension, unspecified: Secondary | ICD-10-CM | POA: Diagnosis present

## 2013-04-30 DIAGNOSIS — G309 Alzheimer's disease, unspecified: Secondary | ICD-10-CM | POA: Diagnosis present

## 2013-04-30 DIAGNOSIS — Z888 Allergy status to other drugs, medicaments and biological substances status: Secondary | ICD-10-CM

## 2013-04-30 LAB — HIV ANTIBODY (ROUTINE TESTING W REFLEX): HIV: NONREACTIVE

## 2013-04-30 LAB — COMPREHENSIVE METABOLIC PANEL
ALBUMIN: 3 g/dL — AB (ref 3.5–5.2)
ALT: 8 U/L (ref 0–53)
AST: 22 U/L (ref 0–37)
Alkaline Phosphatase: 77 U/L (ref 39–117)
BUN: 10 mg/dL (ref 6–23)
CALCIUM: 8.3 mg/dL — AB (ref 8.4–10.5)
CO2: 25 mEq/L (ref 19–32)
Chloride: 101 mEq/L (ref 96–112)
Creatinine, Ser: 0.61 mg/dL (ref 0.50–1.35)
GFR calc non Af Amer: 83 mL/min — ABNORMAL LOW (ref >90)
GLUCOSE: 91 mg/dL (ref 70–99)
POTASSIUM: 4.4 meq/L (ref 3.7–5.3)
SODIUM: 139 meq/L (ref 137–147)
TOTAL PROTEIN: 7.2 g/dL (ref 6.0–8.3)
Total Bilirubin: 0.4 mg/dL (ref 0.3–1.2)

## 2013-04-30 LAB — MRSA PCR SCREENING: MRSA BY PCR: NEGATIVE

## 2013-04-30 LAB — PRO B NATRIURETIC PEPTIDE
PRO B NATRI PEPTIDE: 1891 pg/mL — AB (ref 0–450)
Pro B Natriuretic peptide (BNP): 1756 pg/mL — ABNORMAL HIGH (ref 0–450)

## 2013-04-30 LAB — CBC WITH DIFFERENTIAL/PLATELET
BASOS PCT: 0 % (ref 0–1)
Basophils Absolute: 0 10*3/uL (ref 0.0–0.1)
EOS ABS: 0 10*3/uL (ref 0.0–0.7)
EOS PCT: 0 % (ref 0–5)
HCT: 36 % — ABNORMAL LOW (ref 39.0–52.0)
Hemoglobin: 11.9 g/dL — ABNORMAL LOW (ref 13.0–17.0)
LYMPHS ABS: 0.6 10*3/uL — AB (ref 0.7–4.0)
Lymphocytes Relative: 9 % — ABNORMAL LOW (ref 12–46)
MCH: 29.6 pg (ref 26.0–34.0)
MCHC: 33.1 g/dL (ref 30.0–36.0)
MCV: 89.6 fL (ref 78.0–100.0)
MONOS PCT: 7 % (ref 3–12)
Monocytes Absolute: 0.5 10*3/uL (ref 0.1–1.0)
NEUTROS PCT: 84 % — AB (ref 43–77)
Neutro Abs: 5.5 10*3/uL (ref 1.7–7.7)
PLATELETS: 174 10*3/uL (ref 150–400)
RBC: 4.02 MIL/uL — AB (ref 4.22–5.81)
RDW: 15.5 % (ref 11.5–15.5)
WBC: 6.5 10*3/uL (ref 4.0–10.5)

## 2013-04-30 LAB — STREP PNEUMONIAE URINARY ANTIGEN: Strep Pneumo Urinary Antigen: NEGATIVE

## 2013-04-30 LAB — TROPONIN I: Troponin I: <0.3 ng/mL (ref <0.30)

## 2013-04-30 MED ORDER — METHYLPREDNISOLONE SODIUM SUCC 125 MG IJ SOLR
60.0000 mg | INTRAMUSCULAR | Status: DC
  Administered 2013-04-30 – 2013-05-04 (×5): 60 mg via INTRAVENOUS
  Filled 2013-04-30 (×6): qty 0.96

## 2013-04-30 MED ORDER — DONEPEZIL HCL 5 MG PO TABS
5.0000 mg | ORAL_TABLET | Freq: Every day | ORAL | Status: DC
  Administered 2013-05-02: 5 mg via ORAL
  Filled 2013-04-30 (×7): qty 1

## 2013-04-30 MED ORDER — ACETAMINOPHEN 325 MG PO TABS
650.0000 mg | ORAL_TABLET | Freq: Once | ORAL | Status: AC
  Administered 2013-04-30: 650 mg via ORAL
  Filled 2013-04-30: qty 2

## 2013-04-30 MED ORDER — ALBUTEROL SULFATE (2.5 MG/3ML) 0.083% IN NEBU
2.5000 mg | INHALATION_SOLUTION | Freq: Four times a day (QID) | RESPIRATORY_TRACT | Status: DC
Start: 2013-04-30 — End: 2013-04-30

## 2013-04-30 MED ORDER — LEVOFLOXACIN IN D5W 750 MG/150ML IV SOLN
750.0000 mg | INTRAVENOUS | Status: DC
  Administered 2013-04-30: 750 mg via INTRAVENOUS
  Filled 2013-04-30 (×2): qty 150

## 2013-04-30 MED ORDER — DEXTROSE 5 % IV SOLN: 1.0000 g | INTRAVENOUS | Status: DC

## 2013-04-30 MED ORDER — DM-GUAIFENESIN ER 30-600 MG PO TB12
1.0000 | ORAL_TABLET | Freq: Two times a day (BID) | ORAL | Status: DC
  Administered 2013-05-02 – 2013-05-04 (×3): 1 via ORAL
  Filled 2013-04-30 (×12): qty 1

## 2013-04-30 MED ORDER — SODIUM CHLORIDE 0.9 % IV SOLN
INTRAVENOUS | Status: DC
  Administered 2013-04-30: 17:00:00 via INTRAVENOUS

## 2013-04-30 MED ORDER — HYDRALAZINE HCL 20 MG/ML IJ SOLN: 10.0000 mg | Freq: Once | INTRAMUSCULAR | Status: DC

## 2013-04-30 MED ORDER — HALOPERIDOL LACTATE 5 MG/ML IJ SOLN
5.0000 mg | Freq: Once | INTRAMUSCULAR | Status: AC
  Administered 2013-04-30: 5 mg via INTRAVENOUS
  Filled 2013-04-30: qty 1

## 2013-04-30 MED ORDER — IPRATROPIUM-ALBUTEROL 0.5-2.5 (3) MG/3ML IN SOLN
3.0000 mL | Freq: Four times a day (QID) | RESPIRATORY_TRACT | Status: DC
  Administered 2013-04-30 – 2013-05-05 (×10): 3 mL via RESPIRATORY_TRACT
  Filled 2013-04-30 (×36): qty 3

## 2013-04-30 MED ORDER — DEXTROSE 5 % IV SOLN
1.0000 g | Freq: Once | INTRAVENOUS | Status: AC
  Administered 2013-04-30: 1 g via INTRAVENOUS
  Filled 2013-04-30: qty 10

## 2013-04-30 MED ORDER — AZITHROMYCIN 500 MG PO TABS: 500.0000 mg | ORAL_TABLET | Freq: Every day | ORAL | Status: DC

## 2013-04-30 MED ORDER — CHLORHEXIDINE GLUCONATE 0.12 % MT SOLN
15.0000 mL | Freq: Two times a day (BID) | OROMUCOSAL | Status: DC
  Administered 2013-04-30 – 2013-05-04 (×3): 15 mL via OROMUCOSAL
  Filled 2013-04-30 (×9): qty 15

## 2013-04-30 MED ORDER — ENOXAPARIN SODIUM 40 MG/0.4ML ~~LOC~~ SOLN
40.0000 mg | SUBCUTANEOUS | Status: DC
  Administered 2013-04-30 – 2013-05-02 (×2): 40 mg via SUBCUTANEOUS
  Filled 2013-04-30 (×7): qty 0.4

## 2013-04-30 MED ORDER — AZITHROMYCIN 250 MG PO TABS
500.0000 mg | ORAL_TABLET | Freq: Once | ORAL | Status: AC
  Administered 2013-04-30: 500 mg via ORAL
  Filled 2013-04-30: qty 2

## 2013-04-30 MED ORDER — SODIUM CHLORIDE 0.9 % IV SOLN
INTRAVENOUS | Status: DC
  Administered 2013-04-30 – 2013-05-04 (×9): via INTRAVENOUS
  Administered 2013-05-05: 75 mL/h via INTRAVENOUS

## 2013-04-30 MED ORDER — LABETALOL HCL 5 MG/ML IV SOLN
10.0000 mg | INTRAVENOUS | Status: DC | PRN
  Administered 2013-05-01 (×2): 10 mg via INTRAVENOUS
  Filled 2013-04-30 (×2): qty 4

## 2013-04-30 MED ORDER — BIOTENE DRY MOUTH MT LIQD
15.0000 mL | Freq: Two times a day (BID) | OROMUCOSAL | Status: DC
  Administered 2013-05-01 – 2013-05-04 (×3): 15 mL via OROMUCOSAL

## 2013-04-30 MED ORDER — LABETALOL HCL 5 MG/ML IV SOLN
10.0000 mg | Freq: Once | INTRAVENOUS | Status: AC
  Administered 2013-04-30: 10 mg via INTRAVENOUS

## 2013-04-30 NOTE — ED Notes (Signed)
Per EMS patient has cough and fever. Unknown to EMS how long patient has had these symptoms. Was recently treated for pneumonia which he has finished his course of antibiotics.

## 2013-04-30 NOTE — H&P (Signed)
[facility] History and Physical  Cephas Revard ZOX:[id number] DOB: [birth year] DOA: 04/30/2013  Referring physician: PCP: Default, Provider, MD  Specialists:   Chief Complaint: Increased confusion, hypoxic  HPI: Ivan Young  80 yo BM PMHx Salivary Duct Stone, Renal Failure, Asthma, S/P partial Lung Resection,  Alzheimer Dementia. Per wife she found patient in the garage without cloths, and contacted EMS Presented ED department via EMS with cough and fever. Patient's also had some shortness of breath. Recently treated for pneumonia as an outpatient with an unknown antibiotic.     Review of Systems: The patient denies anorexia, fever, weight loss,, vision loss, decreased hearing, hoarseness, chest pain, syncope, dyspnea on exertion, peripheral edema, balance deficits, hemoptysis, abdominal pain, melena, hematochezia, severe indigestion/heartburn, hematuria, incontinence, genital sores, muscle weakness, suspicious skin lesions, transient blindness, difficulty walking, depression, unusual weight change, abnormal bleeding, enlarged lymph nodes, angioedema, and breast masses.    TRAVEL HISTORY: None   Procedure CXR 04/30/2013 Emphysematous changes with prominent parenchymal densities at the  right lung base. The right basilar densities are slightly more  conspicuous than the previous examination and difficult to exclude  acute on chronic disease.   Antibiotics Levofloxacin 1/3>>   Past Medical History  Diagnosis Date  . Alzheimer disease   . Asthma   . Bronchitis   . Cataracts, bilateral   . Alzheimer's dementia    Past Surgical History  Procedure Laterality Date  . Prostatectomy  1992  . Lung removal, partial    . Cataract extraction, bilateral     Social History:  reports that he has quit smoking. He does not have any smokeless tobacco history on file. He reports that he does not drink alcohol or use illicit drugs. where does patient live--home, ALF, SNF? Home with wife   Can patient participate in ADLs? Yes  Allergies  Allergen Reactions  . Penicillins Anaphylaxis  . Droperidol     unknown  . Toradol [Ketorolac Tromethamine] Rash    No family history on file.   Prior to Admission medications   Medication Sig Start Date End Date Taking? Authorizing Provider  dextromethorphan-guaiFENesin (MUCINEX DM) 30-600 MG per 12 hr tablet Take 1 tablet by mouth every 12 (twelve) hours.   Yes Historical Provider, MD  donepezil (ARICEPT) 5 MG tablet Take 5 mg by mouth at bedtime.   Yes Historical Provider, MD   Physical Exam: Filed Vitals:   04/30/13 1630 04/30/13 1700 04/30/13 1730 04/30/13 1732  BP: 214/94 167/40 159/97   Pulse: 102 64 81   Temp:      TempSrc:      Resp: 24 22 20    Height:      Weight:      SpO2: 99% 96% 94% 91%     General: A/O x1 (knows his name), follows all commands, pleasant  Eyes: Pupils equal reactive to light and accommodation  Neck: Negative JVD, negative lymphadenopathy  Cardiovascular: Regular rate, NSR with PVCs, negative murmurs rubs or gallops, DP/PT pulse one plus bilateral  Respiratory: Diffuse rhonchi greatest bibasilar  Abdomen: Soft, nontender, nondistended, plus bowel side  Skin: Small cut on right second metacarpal  Musculoskeletal: Negative pedal edema, negative cyanosis,  Psychiatric: Pleasantly demented  Neurologic: Cranial nerves II through XII intact, moves all extremities to command, extremity strength 5/5, sensation intact  Labs on Admission:  Basic Metabolic Panel:  Recent Labs Lab 04/30/13 1219  NA 139  K 4.4  CL 101  CO2 25  GLUCOSE 91  BUN 10  CREATININE 0.61  CALCIUM 8.3*   Liver Function Tests:  Recent Labs Lab 04/30/13 1219  AST 22  ALT 8  ALKPHOS 77  BILITOT 0.4  PROT 7.2  ALBUMIN 3.0*   No results found for this basename: LIPASE, AMYLASE,  in the last 168 hours No results found for this basename: AMMONIA,  in the last 168 hours CBC:  Recent Labs Lab  04/30/13 1219  WBC 6.5  NEUTROABS 5.5  HGB 11.9*  HCT 36.0*  MCV 89.6  PLT 174   Cardiac Enzymes: No results found for this basename: CKTOTAL, CKMB, CKMBINDEX, TROPONINI,  in the last 168 hours  BNP (last 3 results) No results found for this basename: PROBNP,  in the last 8760 hours CBG: No results found for this basename: GLUCAP,  in the last 168 hours  Radiological Exams on Admission: Dg Chest 2 View  04/30/2013   CLINICAL DATA:  Fever and cough.  EXAM: CHEST  2 VIEW  COMPARISON:  09/24/2012 and CT 10/05/2011  FINDINGS: Two views of the chest demonstrate severe emphysematous changes in the upper lungs. Slightly increased parenchymal densities at the right lung base. Heart size is grossly stable. There is a stable partially calcified nodule in the right mid chest that roughly measures 1.2 cm. Again noted are compression fractures in the mid and lower thoracic spine which have not significantly changed.  IMPRESSION: Emphysematous changes with prominent parenchymal densities at the right lung base. The right basilar densities are slightly more conspicuous than the previous examination and difficult to exclude acute on chronic disease.   Electronically Signed   By: Richarda OverlieAdam  Henn M.D.   On: 04/30/2013 12:20    EKG: Pending   Assessment/Plan Active Problems:   COPD   Renal failure   Community acquired pneumonia   CAP (community acquired pneumonia)   HTN (hypertension)   Anemia  CAP -Levofloxacin per pharmacy -DuoNeb -Solu-Medrol   COPD  -See CAP    HTN -Most likely secondary to patient's pneumonia which has been causing him episodes of hypoxemia -Could also be secondary to untreated primary HTN. Since patient responds well to IV labetalol, will control with PRN labetalol. SBP> 165 or DBP> 100 administered labetalol 10 mg every 2 hours  Anemia -Most likely chronic will monitor at this time and if appears to trend down will perform workup   Code Status:  Full  Family  Communication: Wife present for discussion of plan of care Disposition Plan: Resolution of mental status change/pneumonia  Time spent: 70 minutes  Ivan Young, Ivan Young, Ivan Young [facility] Pager [phone]  If 7PM-7AM, please contact night-coverage [email or web address] Password [email or web address] 04/30/2013, 5:54 PM

## 2013-04-30 NOTE — Progress Notes (Signed)
ANTIBIOTIC CONSULT NOTE - INITIAL  Pharmacy Consult for Levaquin Indication: pneumonia  Allergies  Allergen Reactions  . Penicillins Anaphylaxis  . Droperidol     unknown  . Toradol [Ketorolac Tromethamine] Rash    Patient Measurements: Height: 5\' 11"  (180.3 cm) Weight: 120 lb 5.9 oz (54.6 kg) IBW/kg (Calculated) : 75.3  Vital Signs: Temp: 99 F (37.2 C) (01/03 1625) Temp src: Oral (01/03 1625) BP: 214/94 mmHg (01/03 1630) Pulse Rate: 102 (01/03 1630) Intake/Output from previous day:   Intake/Output from this shift: Total I/O In: 125 [I.V.:125] Out: -   Labs:  Recent Labs  04/30/13 1219  WBC 6.5  HGB 11.9*  PLT 174  CREATININE 0.61   Estimated Creatinine Clearance: 43.6 ml/min (by C-G formula based on Cr of 0.61). No results found for this basename: VANCOTROUGH, VANCOPEAK, VANCORANDOM, GENTTROUGH, GENTPEAK, GENTRANDOM, TOBRATROUGH, TOBRAPEAK, TOBRARND, AMIKACINPEAK, AMIKACINTROU, AMIKACIN,  in the last 72 hours   Microbiology: No results found for this or any previous visit (from the past 720 hour(s)).  Medical History: Past Medical History  Diagnosis Date  . Alzheimer disease   . Asthma   . Bronchitis   . Cataracts, bilateral   . Alzheimer's dementia     Medications:  Scheduled:  . dextromethorphan-guaiFENesin  1 tablet Oral BID  . donepezil  5 mg Oral QHS  . enoxaparin (LOVENOX) injection  40 mg Subcutaneous Q24H  . hydrALAZINE  10 mg Intravenous Once  . ipratropium-albuterol  3 mL Nebulization QID   Infusions:  . sodium chloride 125 mL/hr at 04/30/13 1227  . sodium chloride 75 mL/hr at 04/30/13 1645   Assessment: 80 yo male presents with CAP. Recently treated for pneumonia as an outpatient with an unknown antibiotic. Pharmacy asked to dose Levaquin per protocol.  Goal of Therapy:  Eradication of infection Dose per renal function  Plan:   Begin Levaquin 750mg  IV q48h for CrCl < 50 ml/min Follow up renal function & cultures  Loralee PacasErin  Chivon Lepage, PharmD, BCPS Pager: [phone] 04/30/2013,4:50 PM

## 2013-04-30 NOTE — ED Provider Notes (Signed)
CSN: [id number]     Arrival date & time 04/30/13  1105 History   First MD Initiated Contact with Patient 04/30/13 1111     Chief Complaint  Patient presents with  . Fever  . Cough    HPI Patient presents emergency department via EMS with cough and fever.  Patient's also had some shortness of breath.  Recently treated for pneumonia as an outpatient with an unknown metabolic.  According to the wife she called EMS because he was very confused and was out in the garage with no shoes on in no close on.  Amylase arrived and found to be hypoxic placed on oxygen and transported him to [facility] long emergency department.    Past Medical History  Diagnosis Date  . Alzheimer disease   . Asthma   . Bronchitis   . Cataracts, bilateral   . Alzheimer's dementia    Past Surgical History  Procedure Laterality Date  . Prostatectomy  1992  . Lung removal, partial    . Cataract extraction, bilateral     No family history on file. History  Substance Use Topics  . Smoking status: Former Smoker -- 0.25 packs/day  . Smokeless tobacco: Not on file  . Alcohol Use: No    Review of Systems  Constitutional: Positive for fever and chills. Negative for unexpected weight change.  Respiratory: Positive for stridor. Negative for wheezing.   Cardiovascular: Positive for chest pain.  All other systems reviewed and are negative.    Allergies  Penicillins; Droperidol; and Toradol  Home Medications   Current Outpatient Rx  Name  Route  Sig  Dispense  Refill  . ALPRAZolam (XANAX) 1 MG tablet   Oral   Take 1 mg by mouth 3 (three) times daily as needed for sleep or anxiety.         Marland Kitchen dextromethorphan-guaiFENesin (MUCINEX DM) 30-600 MG per 12 hr tablet   Oral   Take 1 tablet by mouth every 12 (twelve) hours.         Marland Kitchen donepezil (ARICEPT) 10 MG tablet   Oral   Take 10 mg by mouth at bedtime.          . ferrous sulfate 325 (65 FE) MG tablet   Oral   Take 325 mg by mouth at bedtime.         .  folic acid (FOLVITE) 1 MG tablet   Oral   Take 1 mg by mouth daily.         . furosemide (LASIX) 80 MG tablet   Oral   Take 80 mg by mouth 2 (two) times daily.         Marland Kitchen lactulose, encephalopathy, (CHRONULAC) 10 GM/15ML SOLN   Oral   Take 30 g by mouth 5 (five) times daily.         Marland Kitchen levETIRAcetam (KEPPRA) 500 MG tablet   Oral   Take 500-1,000 mg by mouth 2 (two) times daily. 1000 mg in the morning and 500 mg at bedtime         . levothyroxine (SYNTHROID, LEVOTHROID) 75 MCG tablet   Oral   Take 75 mcg by mouth daily before breakfast.         . Multiple Vitamin (MULTIVITAMIN WITH MINERALS) TABS   Oral   Take 1 tablet by mouth daily.         Marland Kitchen omeprazole (PRILOSEC) 20 MG capsule   Oral   Take 20 mg by mouth daily.         Marland Kitchen  oxycodone (OXY-IR) 5 MG capsule   Oral   Take 10 mg by mouth every 4 (four) hours as needed for pain.         . potassium chloride (K-DUR,KLOR-CON) 10 MEQ tablet   Oral   Take 10 mEq by mouth daily.         . risperiDONE (RISPERDAL) 0.25 MG tablet   Oral   Take 0.25 mg by mouth 2 (two) times daily.         . spironolactone (ALDAMarland KitchenNE) 100 MG tablet   Oral   Take 200 mg by mouth daily.          BP 148/86  Pulse 84  Temp(Src) 101.2 F (38.4 C) (Oral)  Resp 25  SpO2 98% Physical Exam  Nursing note and vitals reviewed. Constitutional: He is oriented to person, place, and time. He appears well-developed and well-nourished. He is easily aroused. No distress.  HENT:  Head: Normocephalic and atraumatic.  Eyes: Pupils are equal, round, and reactive to light.  Neck: Normal range of motion.  Cardiovascular: Normal rate and intact distal pulses.   Pulmonary/Chest: No respiratory distress.  Abdominal: Normal appearance. He exhibits no distension.  Musculoskeletal: Normal range of motion.  Neurological: He is alert, oriented to person, place, and time and easily aroused. No cranial nerve deficit.  Skin: Skin is warm and dry. No  rash noted.  Psychiatric: He has a normal mood and affect. His behavior is normal.    ED Course  Procedures (including critical care time) Medications  0.9 %  sodium chloride infusion ( Intravenous New Bag/Given 04/30/13 1227)  cefTRIAXone (ROCEPHIN) 1 g in dextrose 5 % 50 mL IVPB (not administered)  azithromycin (ZITHROMAX) tablet 500 mg (not administered)  acetaminophen (TYLENOL) tablet 650 mg (650 mg Oral Given 04/30/13 1328)    Labs Review Labs Reviewed  CBC WITH DIFFERENTIAL - Abnormal; Notable for the following:    RBC 4.02 (*)    Hemoglobin 11.9 (*)    HCT 36.0 (*)    Neutrophils Relative % 84 (*)    Lymphocytes Relative 9 (*)    Lymphs Abs 0.6 (*)    All other components within normal limits  COMPREHENSIVE METABOLIC PANEL - Abnormal; Notable for the following:    Calcium 8.3 (*)    Albumin 3.0 (*)    GFR calc non Af Amer 83 (*)    All other components within normal limits   Imaging Review Dg Chest 2 View  IMPRESSION: Emphysematous changes with prominent parenchymal densities at the right lung base. The right basilar densities are slightly more conspicuous than the previous examination and difficult to exclude acute on chronic disease.  Electronically Signed   By: Richarda OverlieAdam  Henn M.D.   On: 04/30/2013 12:20    EKG Interpretation   None       MDM   1. Community acquired pneumonia        Nelia Shiobert L Harshita Bernales, MD 04/30/13 1410

## 2013-04-30 NOTE — ED Notes (Signed)
Bed: [id number] Expected date: 04/30/13 Expected time: 11:06 AM Means of arrival: Ambulance Comments: fever

## 2013-05-01 LAB — CBC WITH DIFFERENTIAL/PLATELET
BASOS ABS: 0 10*3/uL (ref 0.0–0.1)
Basophils Relative: 0 % (ref 0–1)
EOS ABS: 0 10*3/uL (ref 0.0–0.7)
EOS PCT: 0 % (ref 0–5)
HEMATOCRIT: 33.2 % — AB (ref 39.0–52.0)
Hemoglobin: 10.9 g/dL — ABNORMAL LOW (ref 13.0–17.0)
Lymphocytes Relative: 5 % — ABNORMAL LOW (ref 12–46)
Lymphs Abs: 0.4 10*3/uL — ABNORMAL LOW (ref 0.7–4.0)
MCH: 29.5 pg (ref 26.0–34.0)
MCHC: 32.8 g/dL (ref 30.0–36.0)
MCV: 89.7 fL (ref 78.0–100.0)
Monocytes Absolute: 0.2 10*3/uL (ref 0.1–1.0)
Monocytes Relative: 3 % (ref 3–12)
Neutro Abs: 6.5 10*3/uL (ref 1.7–7.7)
Neutrophils Relative %: 92 % — ABNORMAL HIGH (ref 43–77)
PLATELETS: 156 10*3/uL (ref 150–400)
RBC: 3.7 MIL/uL — ABNORMAL LOW (ref 4.22–5.81)
RDW: 15.3 % (ref 11.5–15.5)
WBC: 7.1 10*3/uL (ref 4.0–10.5)

## 2013-05-01 LAB — COMPREHENSIVE METABOLIC PANEL
ALBUMIN: 2.7 g/dL — AB (ref 3.5–5.2)
ALK PHOS: 70 U/L (ref 39–117)
ALT: 7 U/L (ref 0–53)
AST: 17 U/L (ref 0–37)
BILIRUBIN TOTAL: 0.3 mg/dL (ref 0.3–1.2)
BUN: 11 mg/dL (ref 6–23)
CHLORIDE: 102 meq/L (ref 96–112)
CO2: 24 meq/L (ref 19–32)
Calcium: 8.1 mg/dL — ABNORMAL LOW (ref 8.4–10.5)
Creatinine, Ser: 0.57 mg/dL (ref 0.50–1.35)
GFR calc Af Amer: >90 mL/min (ref >90)
GFR, EST NON AFRICAN AMERICAN: 85 mL/min — AB (ref >90)
Glucose, Bld: 125 mg/dL — ABNORMAL HIGH (ref 70–99)
POTASSIUM: 3.8 meq/L (ref 3.7–5.3)
Sodium: 138 mEq/L (ref 137–147)
Total Protein: 6.7 g/dL (ref 6.0–8.3)

## 2013-05-01 LAB — LEGIONELLA ANTIGEN, URINE: Legionella Antigen, Urine: NEGATIVE

## 2013-05-01 LAB — MAGNESIUM: Magnesium: 1.9 mg/dL (ref 1.5–2.5)

## 2013-05-01 LAB — INFLUENZA PANEL BY PCR (TYPE A & B)
H1N1FLUPCR: NOT DETECTED
INFLAPCR: NEGATIVE
Influenza B By PCR: NEGATIVE

## 2013-05-01 MED ORDER — LORAZEPAM 2 MG/ML IJ SOLN
1.0000 mg | Freq: Four times a day (QID) | INTRAMUSCULAR | Status: DC | PRN
  Administered 2013-05-01 – 2013-05-05 (×5): 1 mg via INTRAVENOUS
  Filled 2013-05-01 (×5): qty 1

## 2013-05-01 MED ORDER — HALOPERIDOL LACTATE 5 MG/ML IJ SOLN
5.0000 mg | Freq: Two times a day (BID) | INTRAMUSCULAR | Status: DC
  Administered 2013-05-01 (×2): 5 mg via INTRAVENOUS
  Filled 2013-05-01 (×3): qty 1

## 2013-05-01 MED ORDER — HYDRALAZINE HCL 20 MG/ML IJ SOLN
INTRAMUSCULAR | Status: AC
  Administered 2013-05-01: 07:00:00 10 mg via INTRAVENOUS
  Filled 2013-05-01: qty 1

## 2013-05-01 MED ORDER — LABETALOL HCL 100 MG PO TABS
100.0000 mg | ORAL_TABLET | Freq: Two times a day (BID) | ORAL | Status: DC
  Administered 2013-05-03 – 2013-05-04 (×2): 100 mg via ORAL
  Filled 2013-05-01 (×10): qty 1

## 2013-05-01 MED ORDER — HYDRALAZINE HCL 20 MG/ML IJ SOLN
10.0000 mg | Freq: Once | INTRAMUSCULAR | Status: AC
  Administered 2013-05-01: 10 mg via INTRAVENOUS

## 2013-05-01 MED ORDER — HYDRALAZINE HCL 20 MG/ML IJ SOLN
10.0000 mg | INTRAMUSCULAR | Status: DC | PRN
  Administered 2013-05-01 – 2013-05-04 (×4): 10 mg via INTRAVENOUS
  Filled 2013-05-01 (×4): qty 1

## 2013-05-01 MED ORDER — HALOPERIDOL LACTATE 5 MG/ML IJ SOLN
INTRAMUSCULAR | Status: AC
  Filled 2013-05-01: qty 1

## 2013-05-01 MED ORDER — INFLUENZA VAC SPLIT QUAD 0.5 ML IM SUSP
0.5000 mL | INTRAMUSCULAR | Status: AC
  Administered 2013-05-02: 0.5 mL via INTRAMUSCULAR
  Filled 2013-05-01 (×3): qty 0.5

## 2013-05-01 NOTE — Progress Notes (Signed)
1230 Patient agitated, attempting to get OOB.  Patient took off nasal cannula with O2 sats 90%.  Condom catheter off, patient has had incontinent urine episode.  Patient sitting on side of bed with assistance of this RN and NT.  Patient following commands at this time, but continues to attempt to pull at necessary equipment and refuses to allow nasal cannula to be reapplied.  Patient had incontinent urine episode on floor, assisted patient to bedside commode.  Patient bathed and assisted to chair.  Patient is a 2-assist with activity.  Posey belt applied for patient safety.  MD notified.  Will continue to monitor.

## 2013-05-01 NOTE — Progress Notes (Signed)
Patient extremely agitated, attempting to get OOB.  Combative with nursing staff (hitting, kicking, attempting to bite).  Patient unable to be calmed down or reoriented.  3 RNs in room to attempt to calm patient and get back in bed.  Pulling off equipment and refuses to keep nasal cannula on with O2 sats in 80s.  MD notified, new orders received.  Patient continuing to be combative with staff after administration of Haldol 5mg  IV per order.  MD notified again and arrived at bedside to evaluate.  Will continue to monitor.

## 2013-05-01 NOTE — Progress Notes (Signed)
[facility] PROGRESS NOTE  Kwamaine Cuppett YNW:[id number] DOB: Jun 28, 1916 DOA: 04/30/2013 PCP: Default, Provider, MD  Assessment/Plan: CAP  -Continue Levofloxacin per pharmacy  -Continue DuoNeb  -Continue Solu-Medrol  -Start flutter valve during waking hours -Respiratory virus panel pending -Strep pneumonia urinary antigen negative -Continue hydration normal saline at 142ml/hr    COPD  -See CAP   HTN  -Most likely secondary to patient's pneumonia which has been causing him episodes of hypoxemia  -Could also be secondary to untreated primary HTN. Start scheduled labetalol 100 mg  BID -Start hydralazine 10 mg  PRN  SBP> 165 or DBP> 100 -If scheduled labetalol does not bring BP into a manageable level will add Lisinopril  Anemia  -Most likely chronic will monitor at this time and if appears to trend down will perform workup  Alzheimer's dementia -Continue home medication -Has had some agitation overnight will schedule albuterol 5 mg IV BID -If required will Ativan however this would be a last resort.  CHF -Elevated proBNP=  1756, will obtain echocardiogram    Code Status: Full Family Communication:  Plan for discharge:    Consultants:   Procedure MRSA PCR screening 04/30/2013; negative   CXR 04/30/2013  Emphysematous changes with prominent parenchymal densities at the  right lung base. The right basilar densities are slightly more  conspicuous than the previous examination and difficult to exclude  acute on chronic disease.    Antibiotics  Levofloxacin 1/3>>  HPI/Subjective: Ivan Young 80 yo BM PMHx Salivary Duct Stone, Renal Failure, Asthma, S/P partial Lung Resection, Alzheimer Dementia. Per wife she found patient in the garage without cloths, and contacted EMS Presented ED department via EMS with cough and fever. Patient's also had some shortness of breath. Recently treated for pneumonia as an outpatient with an unknown antibiotic. 05/01/2013 RN reports  increased agitation overnight with patient attempted to the bed, Pull out IV lines. RN reports that a one-time dose of haloperidol calmed Pt down and allowed him to rest comfortably. Page 2 step down unit secondary to patient becoming increasingly agitated attempted to bite RN, and managed to kick another RN prior to being restrained. Upon arrival patient appears to be back to his baseline from yesterday of being pleasantly confused, most likely haloperidol now taking effect. Patient has agreed to eat breakfast, and has stopped cutting the medical staff.    Objective: Filed Vitals:   05/01/13 0522 05/01/13 0600 05/01/13 0648 05/01/13 0700  BP: 167/64 178/82 184/78 178/85  Pulse:  72    Temp:      TempSrc:      Resp:  14    Height:      Weight:      SpO2:  96%      Intake/Output Summary (Last 24 hours) at 05/01/13 0756 Last data filed at 05/01/13 6578  Gross per 24 hour  Intake   2050 ml  Output    805 ml  Net   1245 ml   Filed Weights   04/30/13 1625  Weight: 54.6 kg (120 lb 5.9 oz)    Exam:   General: A./O. x1, in NAD, pleasantly confused  Cardiovascular: Regular rhythm, tachycardic, negative murmurs rubs or gallops, DP/PT pulse one plus bilateral  Respiratory: Positive rhonchi left lung base, good air movement with coarse breath sounds remainder of lung fields  Abdomen: Soft, nontender, nondistended, plus bowel sounds  Musculoskeletal: Negative pedal edema negative cyanosis   Data Reviewed: Basic Metabolic Panel:  Recent Labs Lab 04/30/13 1219 05/01/13 0346  NA  139 138  K 4.4 3.8  CL 101 102  CO2 25 24  GLUCOSE 91 125*  BUN 10 11  CREATININE 0.61 0.57  CALCIUM 8.3* 8.1*  MG  --  1.9   Liver Function Tests:  Recent Labs Lab 04/30/13 1219 05/01/13 0346  AST 22 17  ALT 8 7  ALKPHOS 77 70  BILITOT 0.4 0.3  PROT 7.2 6.7  ALBUMIN 3.0* 2.7*   No results found for this basename: LIPASE, AMYLASE,  in the last 168 hours No results found for this  basename: AMMONIA,  in the last 168 hours CBC:  Recent Labs Lab 04/30/13 1219 05/01/13 0346  WBC 6.5 7.1  NEUTROABS 5.5 6.5  HGB 11.9* 10.9*  HCT 36.0* 33.2*  MCV 89.6 89.7  PLT 174 156   Cardiac Enzymes:  Recent Labs Lab 04/30/13 1802  TROPONINI <0.30   BNP (last 3 results)  Recent Labs  04/30/13 1713 04/30/13 1802  PROBNP 1891.0* 1756.0*   CBG: No results found for this basename: GLUCAP,  in the last 168 hours  Recent Results (from the past 240 hour(s))  MRSA PCR SCREENING     Status: None   Collection Time    04/30/13  4:30 PM      Result Value Range Status   MRSA by PCR NEGATIVE  NEGATIVE Final   Comment:            The GeneXpert MRSA Assay (FDA     approved for NASAL specimens     only), is one component of a     comprehensive MRSA colonization     surveillance program. It is not     intended to diagnose MRSA     infection nor to guide or     monitor treatment for     MRSA infections.     Studies: Dg Chest 2 View  04/30/2013   CLINICAL DATA:  Fever and cough.  EXAM: CHEST  2 VIEW  COMPARISON:  09/24/2012 and CT 10/05/2011  FINDINGS: Two views of the chest demonstrate severe emphysematous changes in the upper lungs. Slightly increased parenchymal densities at the right lung base. Heart size is grossly stable. There is a stable partially calcified nodule in the right mid chest that roughly measures 1.2 cm. Again noted are compression fractures in the mid and lower thoracic spine which have not significantly changed.  IMPRESSION: Emphysematous changes with prominent parenchymal densities at the right lung base. The right basilar densities are slightly more conspicuous than the previous examination and difficult to exclude acute on chronic disease.   Electronically Signed   By: Richarda OverlieAdam  Henn M.D.   On: 04/30/2013 12:20    Scheduled Meds: . antiseptic oral rinse  15 mL Mouth Rinse q12n4p  . chlorhexidine  15 mL Mouth Rinse BID  . dextromethorphan-guaiFENesin  1  tablet Oral BID  . donepezil  5 mg Oral QHS  . enoxaparin (LOVENOX) injection  40 mg Subcutaneous Q24H  . haloperidol lactate      . haloperidol lactate  5 mg Intravenous BID  . [START ON 05/02/2013] influenza vac split quadrivalent PF  0.5 mL Intramuscular Tomorrow-1000  . ipratropium-albuterol  3 mL Nebulization QID  . labetalol  100 mg Oral BID  . levofloxacin (LEVAQUIN) IV  750 mg Intravenous Q48H  . methylPREDNISolone (SOLU-MEDROL) injection  60 mg Intravenous Q24H   Continuous Infusions: . sodium chloride 125 mL/hr at 05/01/13 [id number]    Active Problems:   COPD   Renal failure  Community acquired pneumonia   CAP (community acquired pneumonia)   HTN (hypertension)   Anemia    Time spent: 30 min    WOODS, CURTIS, J  [facility] Pager [phone]. If 7PM-7AM, please contact night-coverage at [email or web address], password [facility] 05/01/2013, 7:56 AM  LOS: 1 day

## 2013-05-02 DIAGNOSIS — I369 Nonrheumatic tricuspid valve disorder, unspecified: Secondary | ICD-10-CM

## 2013-05-02 DIAGNOSIS — J210 Acute bronchiolitis due to respiratory syncytial virus: Secondary | ICD-10-CM

## 2013-05-02 LAB — COMPREHENSIVE METABOLIC PANEL
ALK PHOS: 68 U/L (ref 39–117)
ALT: 7 U/L (ref 0–53)
AST: 25 U/L (ref 0–37)
Albumin: 2.6 g/dL — ABNORMAL LOW (ref 3.5–5.2)
BILIRUBIN TOTAL: 0.3 mg/dL (ref 0.3–1.2)
BUN: 16 mg/dL (ref 6–23)
CHLORIDE: 104 meq/L (ref 96–112)
CO2: 22 meq/L (ref 19–32)
Calcium: 8.3 mg/dL — ABNORMAL LOW (ref 8.4–10.5)
Creatinine, Ser: 0.55 mg/dL (ref 0.50–1.35)
GFR calc non Af Amer: 86 mL/min — ABNORMAL LOW (ref >90)
GLUCOSE: 103 mg/dL — AB (ref 70–99)
POTASSIUM: 3.6 meq/L — AB (ref 3.7–5.3)
SODIUM: 140 meq/L (ref 137–147)
TOTAL PROTEIN: 6.5 g/dL (ref 6.0–8.3)

## 2013-05-02 LAB — RESPIRATORY VIRUS PANEL
Adenovirus: NOT DETECTED
Influenza A H1: NOT DETECTED
Influenza A H3: NOT DETECTED
Influenza A: NOT DETECTED
Influenza B: NOT DETECTED
METAPNEUMOVIRUS: NOT DETECTED
PARAINFLUENZA 1 A: NOT DETECTED
Parainfluenza 2: NOT DETECTED
Parainfluenza 3: NOT DETECTED
RESPIRATORY SYNCYTIAL VIRUS A: NOT DETECTED
Respiratory Syncytial Virus B: DETECTED — AB
Rhinovirus: NOT DETECTED

## 2013-05-02 LAB — MAGNESIUM: Magnesium: 2 mg/dL (ref 1.5–2.5)

## 2013-05-02 LAB — CBC WITH DIFFERENTIAL/PLATELET
BASOS ABS: 0 10*3/uL (ref 0.0–0.1)
Basophils Relative: 0 % (ref 0–1)
Eosinophils Absolute: 0 10*3/uL (ref 0.0–0.7)
Eosinophils Relative: 0 % (ref 0–5)
HCT: 34 % — ABNORMAL LOW (ref 39.0–52.0)
HEMOGLOBIN: 11.3 g/dL — AB (ref 13.0–17.0)
Lymphocytes Relative: 4 % — ABNORMAL LOW (ref 12–46)
Lymphs Abs: 0.4 10*3/uL — ABNORMAL LOW (ref 0.7–4.0)
MCH: 29.7 pg (ref 26.0–34.0)
MCHC: 33.2 g/dL (ref 30.0–36.0)
MCV: 89.5 fL (ref 78.0–100.0)
MONOS PCT: 2 % — AB (ref 3–12)
Monocytes Absolute: 0.2 10*3/uL (ref 0.1–1.0)
NEUTROS PCT: 94 % — AB (ref 43–77)
Neutro Abs: 8.6 10*3/uL — ABNORMAL HIGH (ref 1.7–7.7)
Platelets: 157 10*3/uL (ref 150–400)
RBC: 3.8 MIL/uL — ABNORMAL LOW (ref 4.22–5.81)
RDW: 15.5 % (ref 11.5–15.5)
WBC: 9.1 10*3/uL (ref 4.0–10.5)

## 2013-05-02 LAB — CG4 I-STAT (LACTIC ACID): Lactic Acid, Venous: 1.41 mmol/L (ref 0.5–2.2)

## 2013-05-02 MED ORDER — DEXTROSE 5 % IV SOLN
500.0000 mg | INTRAVENOUS | Status: DC
  Administered 2013-05-02 – 2013-05-04 (×3): 500 mg via INTRAVENOUS
  Filled 2013-05-02 (×4): qty 500

## 2013-05-02 MED ORDER — DEXTROSE 5 % IV SOLN
1.0000 g | INTRAVENOUS | Status: DC
  Administered 2013-05-02 – 2013-05-04 (×3): 1 g via INTRAVENOUS
  Filled 2013-05-02 (×4): qty 10

## 2013-05-02 MED ORDER — LISINOPRIL 2.5 MG PO TABS
2.5000 mg | ORAL_TABLET | Freq: Every day | ORAL | Status: DC
  Administered 2013-05-02 – 2013-05-04 (×3): 2.5 mg via ORAL
  Filled 2013-05-02 (×4): qty 1

## 2013-05-02 MED ORDER — HALOPERIDOL LACTATE 5 MG/ML IJ SOLN
5.0000 mg | Freq: Four times a day (QID) | INTRAMUSCULAR | Status: DC
  Administered 2013-05-02 – 2013-05-04 (×11): 5 mg via INTRAVENOUS
  Filled 2013-05-02 (×19): qty 1
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
## 2013-05-02 NOTE — Progress Notes (Signed)
INITIAL NUTRITION ASSESSMENT  DOCUMENTATION CODES Per approved criteria  -Underweight   INTERVENTION: -Recommend Regular diet to encourage PO intake as diet advancement tolerated -Recommend Ensure Complete po BID, each supplement provides 350 kcal and 13 grams of protein. Modify to Ensure pudding if warranted -Honor food preferences   NUTRITION DIAGNOSIS: Inadequate oral intake related to inability to eat as evidenced by NPO status  Goal: Pt to meet >/= 90% of their estimated nutrition needs   Monitor:  Diet advancement, total protein/energy intake, swallow profile, labs, weights  Reason for Assessment: Malnutrition Screening Tool/Underweight BMI  80 y.o. male  Admitting Dx: <principal problem not specified>  ASSESSMENT: Ivan Young 80 yo BM PMHx Salivary Duct Stone, Renal Failure, Asthma, S/P partial Lung Resection, Alzheimer Dementia. Per wife she found patient in the garage without cloths, and contacted EMS Presented ED department via EMS with cough and fever. Patient's also had some shortness of breath. Recently treated for pneumonia as an outpatient with an unknown antibiotic  -Pt's wife reported usual body weight of 160 lbs. Weight has gradually declined over 2 years with worsening dementia -Eats 2 meals/daily and consumes 2-3 Ensures -Very picky eater; wife noted pt dislikes meats/chicken, breads, and water. Enjoys mashed potatoes, rice, and sweet foods -Denied dysphagia -RN noted concern for diet advancement d/t continued lethargy and decreased alertness. Plan to discuss diet advancement with MD later today -Will recommend pt receive Ensure and MagicCup supplement upon diet advancement  Height: Ht Readings from Last 1 Encounters:  04/30/13 5\' 11"  (1.803 m)    Weight: Wt Readings from Last 1 Encounters:  05/02/13 122 lb 12.8 oz (55.702 kg)    Ideal Body Weight: 172 lbs  % Ideal Body Weight: 71%  Wt Readings from Last 10 Encounters:  05/02/13 122 lb 12.8 oz  (55.702 kg)  03/08/11 116 lb 2.9 oz (52.7 kg)    Usual Body Weight: 160 lbs  % Usual Body Weight: 76%  BMI:  Body mass index is 17.13 kg/(m^2).  Estimated Nutritional Needs: Kcal: 1600-1800 Protein: 70-80 gram Fluid: 1700 ml/daily  Skin: WDL  Diet Order: NPO  EDUCATION NEEDS: -No education needs identified at this time   Intake/Output Summary (Last 24 hours) at 05/02/13 1249 Last data filed at 05/02/13 0930  Gross per 24 hour  Intake   2250 ml  Output    100 ml  Net   2150 ml    Last BM: 1/05   Labs:   Recent Labs Lab 04/30/13 1219 05/01/13 0346 05/02/13 0342  NA 139 138 140  K 4.4 3.8 3.6*  CL 101 102 104  CO2 25 24 22   BUN 10 11 16   CREATININE 0.61 0.57 0.55  CALCIUM 8.3* 8.1* 8.3*  MG  --  1.9 2.0  GLUCOSE 91 125* 103*    CBG (last 3)  No results found for this basename: GLUCAP,  in the last 72 hours  Scheduled Meds: . antiseptic oral rinse  15 mL Mouth Rinse q12n4p  . azithromycin  500 mg Intravenous Q24H  . cefTRIAXone (ROCEPHIN)  IV  1 g Intravenous Q24H  . chlorhexidine  15 mL Mouth Rinse BID  . dextromethorphan-guaiFENesin  1 tablet Oral BID  . donepezil  5 mg Oral QHS  . enoxaparin (LOVENOX) injection  40 mg Subcutaneous Q24H  . haloperidol lactate  5 mg Intravenous QID  . influenza vac split quadrivalent PF  0.5 mL Intramuscular Tomorrow-1000  . ipratropium-albuterol  3 mL Nebulization QID  . labetalol  100 mg  Oral BID  . methylPREDNISolone (SOLU-MEDROL) injection  60 mg Intravenous Q24H    Continuous Infusions: . sodium chloride 125 mL/hr at 05/02/13 0541    Past Medical History  Diagnosis Date  . Alzheimer disease   . Asthma   . Bronchitis   . Cataracts, bilateral   . Alzheimer's dementia     Past Surgical History  Procedure Laterality Date  . Prostatectomy  1992  . Lung removal, partial    . Cataract extraction, bilateral      Lloyd HugerSarah F Callum Wolf MS RD LDN Clinical Dietitian [phone]

## 2013-05-02 NOTE — Progress Notes (Addendum)
ANTIBIOTIC CONSULT NOTE - Follow Up  Pharmacy Consult for Levaquin Indication: pneumonia  Allergies  Allergen Reactions  . Penicillins Anaphylaxis    Has tolerated Ceftriaxone.  . Droperidol     unknown  . Toradol [Ketorolac Tromethamine] Rash   Labs:  Recent Labs  04/30/13 1219 05/01/13 0346 05/02/13 0342  WBC 6.5 7.1 9.1  HGB 11.9* 10.9* 11.3*  PLT 174 156 157  CREATININE 0.61 0.57 0.55   Assessment: 80 yo male presents with CAP. Recently treated for pneumonia as an outpatient with an unknown antibiotic. Pharmacy asked to dose Levaquin per protocol.  Day #3 Levaquin.  Goal of Therapy:  Eradication of infection Dose per renal function  Plan:  Prolonged QT interval and serious cardiac arrhythmias, such as torsades de pointes, are a potential when Haloperidol and Quinolones are co-administered.  Patient is now requiring Haldol 5 mg IV QID.  Discussed interaction with MD.  Due to the potential risk with Levaquin + Haldol, Levaquin has been discontinued and Azithromycin and Ceftriaxone will be started for continued treatment of CAP.  Note, that there is still risk of QT prolongation with azithromycin but risk is greater with Levaquin.  Would recommend continued EKG monitoring.  Allergy to penicillins noted but patient has tolerated Ceftriaxone before so will continue with Ceftriaxone order.  Clance BollAmanda Mikita Lesmeister, PharmD, BCPS Pager: [phone] 05/02/2013 12:22 PM

## 2013-05-02 NOTE — Progress Notes (Signed)
Patient came up to floor at 0650. Patient calm, and sleeping, breathing normal. Telemetry applied and VS taken.

## 2013-05-02 NOTE — Progress Notes (Signed)
  Echocardiogram 2D Echocardiogram has been performed.  Ivan Young 05/02/2013, 1:07 PM

## 2013-05-03 DIAGNOSIS — I272 Pulmonary hypertension, unspecified: Secondary | ICD-10-CM | POA: Diagnosis present

## 2013-05-03 DIAGNOSIS — E876 Hypokalemia: Secondary | ICD-10-CM | POA: Diagnosis present

## 2013-05-03 DIAGNOSIS — I2789 Other specified pulmonary heart diseases: Secondary | ICD-10-CM

## 2013-05-03 LAB — CBC WITH DIFFERENTIAL/PLATELET
Basophils Absolute: 0 10*3/uL (ref 0.0–0.1)
Basophils Relative: 0 % (ref 0–1)
Eosinophils Absolute: 0 10*3/uL (ref 0.0–0.7)
Eosinophils Relative: 0 % (ref 0–5)
HCT: 38.6 % — ABNORMAL LOW (ref 39.0–52.0)
Hemoglobin: 13.1 g/dL (ref 13.0–17.0)
LYMPHS ABS: 0.5 10*3/uL — AB (ref 0.7–4.0)
Lymphocytes Relative: 6 % — ABNORMAL LOW (ref 12–46)
MCH: 29.7 pg (ref 26.0–34.0)
MCHC: 33.9 g/dL (ref 30.0–36.0)
MCV: 87.5 fL (ref 78.0–100.0)
MONO ABS: 0.3 10*3/uL (ref 0.1–1.0)
MONOS PCT: 3 % (ref 3–12)
NEUTROS ABS: 8.1 10*3/uL — AB (ref 1.7–7.7)
Neutrophils Relative %: 91 % — ABNORMAL HIGH (ref 43–77)
Platelets: 205 10*3/uL (ref 150–400)
RBC: 4.41 MIL/uL (ref 4.22–5.81)
RDW: 15.4 % (ref 11.5–15.5)
WBC: 8.9 10*3/uL (ref 4.0–10.5)

## 2013-05-03 LAB — MAGNESIUM: Magnesium: 2.1 mg/dL (ref 1.5–2.5)

## 2013-05-03 LAB — COMPREHENSIVE METABOLIC PANEL
ALK PHOS: 72 U/L (ref 39–117)
ALT: 10 U/L (ref 0–53)
AST: 38 U/L — AB (ref 0–37)
Albumin: 2.8 g/dL — ABNORMAL LOW (ref 3.5–5.2)
BILIRUBIN TOTAL: 0.3 mg/dL (ref 0.3–1.2)
BUN: 14 mg/dL (ref 6–23)
CO2: 24 meq/L (ref 19–32)
CREATININE: 0.48 mg/dL — AB (ref 0.50–1.35)
Calcium: 8.6 mg/dL (ref 8.4–10.5)
Chloride: 103 mEq/L (ref 96–112)
GFR calc Af Amer: >90 mL/min (ref >90)
Glucose, Bld: 101 mg/dL — ABNORMAL HIGH (ref 70–99)
Potassium: 3.2 mEq/L — ABNORMAL LOW (ref 3.7–5.3)
Sodium: 142 mEq/L (ref 137–147)
Total Protein: 7 g/dL (ref 6.0–8.3)

## 2013-05-03 MED ORDER — LABETALOL HCL 5 MG/ML IV SOLN
10.0000 mg | INTRAVENOUS | Status: DC | PRN
  Administered 2013-05-04: 2 mg via INTRAVENOUS
  Filled 2013-05-03: qty 4

## 2013-05-03 MED ORDER — LABETALOL HCL 5 MG/ML IV SOLN
10.0000 mg | INTRAVENOUS | Status: DC | PRN
  Filled 2013-05-03: qty 4

## 2013-05-03 MED ORDER — POTASSIUM CHLORIDE 10 MEQ/100ML IV SOLN
10.0000 meq | INTRAVENOUS | Status: AC
  Administered 2013-05-03 (×3): 10 meq via INTRAVENOUS
  Filled 2013-05-03 (×3): qty 100

## 2013-05-03 NOTE — Progress Notes (Signed)
[facility] PROGRESS NOTE  Ivan Young GUR:[id number] DOB: 1917/04/21 DOA: 04/30/2013 PCP: Default, Provider, MD  Assessment/Plan: CAP  -DC Levofloxacin per pharmacy secondary to risk for QT prolongation, start ceftriaxone+ azithromycin -Continue DuoNeb  -Continue Solu-Medrol  -Continue flutter valve during waking hours -Respiratory virus panel; see for results below indices note RSV B positive  -Strep pneumonia urinary antigen negative -Continue hydration normal saline at 154m/hr    COPD  -See CAP   HTN  -Most likely secondary to patient's pneumonia which has been causing him episodes of hypoxemia  -Could also be secondary to untreated primary HTN.  -Patient continues to refuse PO labetalol 100 mg  BID except when family members are present -Continue  hydralazine 10 mg  PRN  SBP> 165 or DBP> 100 -Continue labetalol 10 mg PRN SBP> 165 or DBP> 100 -Continue Lisinopril 2.5 mg -NOTE; patient will be intermittently take medication from medical staff. Which makes control of his HTN/pulmonary hypertension difficult at best. Patient will take medication if wife or daughter present. This problem will need to be addressed prior to patient's discharge (SNF vs H.H), sufficient help to maintain patient on home?  Anemia  -Most likely chronic will monitor at this time and if appears to trend down will perform workup -Stable 1/6   Alzheimer's dementia -Continue home medication -Continues to have agitation overnight will continue haloperidol 5 mg IV  QID. Appears to be effective as patient is much more cooperative today with medical staff -If required will Ativan however this would be a last resort.  CHF -Elevated proBNP=  1756, Echocardiogram; most likely has  Diastolic CHF however echocardiogram not diagnostic. However is diagnostic for pulmonary hypertension; see below for results  Pulmonary hypertension -Control patient's HTN, see above    Code Status: Full Family  Communication:  Plan for discharge:    Consultants:   Procedure Echocardiogram 05/02/2013 - Left ventricle:moderate concentric hypertrophy. Systolic function was vigorous.  LVEF= 65% to 70%. Wall motion was normal; there were no regional wall motion abnormalities.  -The study is not technically sufficient to allow evaluation of LV diastolic function. - Aortic valve: Moderately calcified valve. Trileaflet. Adequate aortic continuous doppler signals were not obtained; may be mild aortic stenosis,  - Left atrium: The atrium was mildly dilated. - Right ventricle: RV systolic pressure: 628BTHg (S, est). - Right atrium: The atrium was mildly dilated. - Tricuspid valve: Moderate regurgitation. - Systemic veins: The IVC was not visualized. - Pericardium, extracardiac: There was no pericardial effusion.     Respiratory virus panel 04/30/2013 Respiratory Syncytial Virus A NOT DETECTED Respiratory Syncytial Virus B (Abnormal) DETECTED  Influenza A NOT DETECTED Influenza B NOT DETECTED  Parainfluenza 1 NOT DETECTED  Parainfluenza 2 NOT DETECTED  Parainfluenza 3 NOT DETECTED  Metapneumovirus NOT DETECTED  Rhinovirus NOT DETECTED  Adenovirus NOT DETECTED  Influenza A H1 NOT DETECTED  Influenza A H3 NOT DETECTED   MRSA PCR screening 04/30/2013; negative   CXR 04/30/2013  Emphysematous changes with prominent parenchymal densities at the  right lung base. The right basilar densities are slightly more  conspicuous than the previous examination and difficult to exclude  acute on chronic disease.    Antibiotics  Levofloxacin 1/3>>  HPI/Subjective: Ivan [place]89yo BM PMHx Salivary Duct Stone, Renal Failure, Asthma, S/P partial Lung Resection, Alzheimer Dementia. Per wife she found patient in the garage without cloths, and contacted EMS Presented ED department via EMS with cough and fever. Patient's also had some shortness of breath. Recently treated  for pneumonia as an outpatient with an  unknown antibiotic. 05/01/2013 RN reports increased agitation overnight with patient attempted to the bed, Pull out IV lines. RN reports that a one-time dose of haloperidol calmed Pt down and allowed him to rest comfortably. Page 2 step down unit secondary to patient becoming increasingly agitated attempted to bite RN, and managed to kick another RN prior to being restrained. Upon arrival patient appears to be back to his baseline from yesterday of being pleasantly confused, most likely haloperidol now taking effect. Patient has agreed to eat breakfast, and has stopped cutting the medical staff. 05/02/2013 patient sleepy but cooperative did allow exam. 05/03/2013 patient pleasant this a.m., cooperate with all commands, allows exam. Most likely secondary to Haldol taking effect. Patient expresses his hunger this a.m.   Objective: Filed Vitals:   05/02/13 1314 05/02/13 2332 05/03/13 0605 05/03/13 0938  BP: 164/91 175/84 158/95 164/103  Pulse: 98  96 100  Temp: 98 F (36.7 C) 98 F (36.7 C) 98.4 F (36.9 C)   TempSrc: Axillary Axillary Axillary   Resp: 22 20 20    Height:      Weight:      SpO2: 93% 94% 98%     Intake/Output Summary (Last 24 hours) at 05/03/13 1115 Last data filed at 05/03/13 1107  Gross per 24 hour  Intake 3010.42 ml  Output   2450 ml  Net 560.42 ml   Filed Weights   04/30/13 1625 05/02/13 0100 05/02/13 0700  Weight: 54.6 kg (120 lb 5.9 oz) 55.8 kg (123 lb 0.3 oz) 55.702 kg (122 lb 12.8 oz)    Exam:   General: A./O. x1, in NAD, follows commands, except for taking oral medication for allowing breathing treatments unless family members present  Cardiovascular: Regular rhythm, rate, negative murmurs rubs or gallops, DP/PT pulse one plus bilateral  Respiratory: Mild bilateral rhonchi Rt > Lt foot to see significantly improved from 1/5), negative wheezing   Abdomen: Soft, nontender, nondistended, plus bowel sounds  Musculoskeletal: Negative pedal edema negative cyanosis    Data Reviewed: Basic Metabolic Panel:  Recent Labs Lab 04/30/13 1219 05/01/13 0346 05/02/13 0342 05/03/13 0450  NA 139 138 140 142  K 4.4 3.8 3.6* 3.2*  CL 101 102 104 103  CO2 25 24 22 24   GLUCOSE 91 125* 103* 101*  BUN 10 11 16 14   CREATININE 0.61 0.57 0.55 0.48*  CALCIUM 8.3* 8.1* 8.3* 8.6  MG  --  1.9 2.0 2.1   Liver Function Tests:  Recent Labs Lab 04/30/13 1219 05/01/13 0346 05/02/13 0342 05/03/13 0450  AST 22 17 25  38*  ALT 8 7 7 10   ALKPHOS 77 70 68 72  BILITOT 0.4 0.3 0.3 0.3  PROT 7.2 6.7 6.5 7.0  ALBUMIN 3.0* 2.7* 2.6* 2.8*   No results found for this basename: LIPASE, AMYLASE,  in the last 168 hours No results found for this basename: AMMONIA,  in the last 168 hours CBC:  Recent Labs Lab 04/30/13 1219 05/01/13 0346 05/02/13 0342 05/03/13 0450  WBC 6.5 7.1 9.1 8.9  NEUTROABS 5.5 6.5 8.6* 8.1*  HGB 11.9* 10.9* 11.3* 13.1  HCT 36.0* 33.2* 34.0* 38.6*  MCV 89.6 89.7 89.5 87.5  PLT 174 156 157 205   Cardiac Enzymes:  Recent Labs Lab 04/30/13 1802  TROPONINI <0.30   BNP (last 3 results)  Recent Labs  04/30/13 1713 04/30/13 1802  PROBNP 1891.0* 1756.0*   CBG: No results found for this basename: GLUCAP,  in the  last 168 hours  Recent Results (from the past 240 hour(s))  MRSA PCR SCREENING     Status: None   Collection Time    04/30/13  4:30 PM      Result Value Range Status   MRSA by PCR NEGATIVE  NEGATIVE Final   Comment:            The GeneXpert MRSA Assay (FDA     approved for NASAL specimens     only), is one component of a     comprehensive MRSA colonization     surveillance program. It is not     intended to diagnose MRSA     infection nor to guide or     monitor treatment for     MRSA infections.  CULTURE, BLOOD (ROUTINE X 2)     Status: None   Collection Time    04/30/13  5:08 PM      Result Value Range Status   Specimen Description BLOOD RIGHT ARM   Final   Special Requests BOTTLES DRAWN AEROBIC AND ANAEROBIC  [facility]   Final   Culture  Setup Time     Final   Value: 04/30/2013 20:38     Performed at [facility]   Culture     Final   Value:        BLOOD CULTURE RECEIVED NO GROWTH TO DATE CULTURE WILL BE HELD FOR 5 DAYS BEFORE ISSUING A FINAL NEGATIVE REPORT     Performed at [facility]   Report Status PENDING   Incomplete  CULTURE, BLOOD (ROUTINE X 2)     Status: None   Collection Time    04/30/13  5:13 PM      Result Value Range Status   Specimen Description BLOOD LEFT ARM   Final   Special Requests BOTTLES DRAWN AEROBIC ONLY 5CC   Final   Culture  Setup Time     Final   Value: 04/30/2013 20:38     Performed at [facility]   Culture     Final   Value:        BLOOD CULTURE RECEIVED NO GROWTH TO DATE CULTURE WILL BE HELD FOR 5 DAYS BEFORE ISSUING A FINAL NEGATIVE REPORT     Performed at [facility]   Report Status PENDING   Incomplete  RESPIRATORY VIRUS PANEL     Status: Abnormal   Collection Time    04/30/13 10:47 PM      Result Value Range Status   Source - RVPAN NASOPHARYNGEAL   Final   Respiratory Syncytial Virus A NOT DETECTED   Final   Respiratory Syncytial Virus B DETECTED (*)  Final   Influenza A NOT DETECTED   Final   Influenza B NOT DETECTED   Final   Parainfluenza 1 NOT DETECTED   Final   Parainfluenza 2 NOT DETECTED   Final   Parainfluenza 3 NOT DETECTED   Final   Metapneumovirus NOT DETECTED   Final   Rhinovirus NOT DETECTED   Final   Adenovirus NOT DETECTED   Final   Influenza A H1 NOT DETECTED   Final   Influenza A H3 NOT DETECTED   Final   Comment: (NOTE)           Normal Reference Range for each Analyte: NOT DETECTED     Testing performed using the Luminex xTAG Respiratory Viral Panel test     kit.     This test was developed  and its performance characteristics determined     by [facility]. It has not been cleared or approved by the [place]     Food and Drug Administration. This test is used for clinical purposes.     It  should not be regarded as investigational or for research. This     laboratory is certified under the [place] (CLIA) as qualified to perform high complexity     clinical laboratory testing.     Performed at [facility]     Studies: No results found.  Scheduled Meds: . antiseptic oral rinse  15 mL Mouth Rinse q12n4p  . azithromycin  500 mg Intravenous Q24H  . cefTRIAXone (ROCEPHIN)  IV  1 g Intravenous Q24H  . chlorhexidine  15 mL Mouth Rinse BID  . dextromethorphan-guaiFENesin  1 tablet Oral BID  . donepezil  5 mg Oral QHS  . enoxaparin (LOVENOX) injection  40 mg Subcutaneous Q24H  . haloperidol lactate  5 mg Intravenous QID  . ipratropium-albuterol  3 mL Nebulization QID  . labetalol  100 mg Oral BID  . lisinopril  2.5 mg Oral Daily  . methylPREDNISolone (SOLU-MEDROL) injection  60 mg Intravenous Q24H   Continuous Infusions: . sodium chloride 125 mL/hr at 05/02/13 2238    Active Problems:   COPD   Renal failure   Community acquired pneumonia   CAP (community acquired pneumonia)   HTN (hypertension)   Anemia   Acute bronchiolitis due to respiratory syncytial virus (RSV)    Time spent: 40 min    Hellon Vaccarella, J  [facility] Pager [phone]. If 7PM-7AM, please contact night-coverage at [email or web address], password [facility] 05/03/2013, 11:15 AM  LOS: 3 days

## 2013-05-04 LAB — CBC WITH DIFFERENTIAL/PLATELET
BASOS ABS: 0 10*3/uL (ref 0.0–0.1)
Basophils Relative: 0 % (ref 0–1)
EOS PCT: 0 % (ref 0–5)
Eosinophils Absolute: 0 10*3/uL (ref 0.0–0.7)
HEMATOCRIT: 38.3 % — AB (ref 39.0–52.0)
Hemoglobin: 12.9 g/dL — ABNORMAL LOW (ref 13.0–17.0)
LYMPHS PCT: 5 % — AB (ref 12–46)
Lymphs Abs: 0.4 10*3/uL — ABNORMAL LOW (ref 0.7–4.0)
MCH: 29.5 pg (ref 26.0–34.0)
MCHC: 33.7 g/dL (ref 30.0–36.0)
MCV: 87.6 fL (ref 78.0–100.0)
Monocytes Absolute: 0.3 10*3/uL (ref 0.1–1.0)
Monocytes Relative: 4 % (ref 3–12)
Neutro Abs: 7.5 10*3/uL (ref 1.7–7.7)
Neutrophils Relative %: 91 % — ABNORMAL HIGH (ref 43–77)
PLATELETS: 211 10*3/uL (ref 150–400)
RBC: 4.37 MIL/uL (ref 4.22–5.81)
RDW: 15 % (ref 11.5–15.5)
WBC: 8.2 10*3/uL (ref 4.0–10.5)

## 2013-05-04 LAB — COMPREHENSIVE METABOLIC PANEL
ALBUMIN: 2.7 g/dL — AB (ref 3.5–5.2)
ALT: 13 U/L (ref 0–53)
AST: 42 U/L — ABNORMAL HIGH (ref 0–37)
Alkaline Phosphatase: 66 U/L (ref 39–117)
BILIRUBIN TOTAL: 0.3 mg/dL (ref 0.3–1.2)
BUN: 17 mg/dL (ref 6–23)
CHLORIDE: 101 meq/L (ref 96–112)
CO2: 23 mEq/L (ref 19–32)
CREATININE: 0.54 mg/dL (ref 0.50–1.35)
Calcium: 8.4 mg/dL (ref 8.4–10.5)
GFR calc Af Amer: >90 mL/min (ref >90)
GFR, EST NON AFRICAN AMERICAN: 87 mL/min — AB (ref >90)
Glucose, Bld: 107 mg/dL — ABNORMAL HIGH (ref 70–99)
Potassium: 3.6 mEq/L — ABNORMAL LOW (ref 3.7–5.3)
Sodium: 139 mEq/L (ref 137–147)
Total Protein: 6.7 g/dL (ref 6.0–8.3)

## 2013-05-04 LAB — MAGNESIUM: MAGNESIUM: 2.1 mg/dL (ref 1.5–2.5)

## 2013-05-04 NOTE — Progress Notes (Signed)
Respiratory therapy note- Patient will not take breathing treatments, slightly combative with attempting to give. This AM sleepy and took it fine, however 1200 treatment unable to convince to take therapy.

## 2013-05-04 NOTE — Progress Notes (Signed)
Pt continues to refuse most of his PO medication.  Gave labetalol IV at hs for bp of 167/89.  And IV Ativan at 0219 for agitation/restlessness.

## 2013-05-04 NOTE — Evaluation (Signed)
Physical Therapy Evaluation Patient Details Name: Junious DresserOtis Facundo MRN: [id number] DOB: [birth year] Today's Date: 05/04/2013 Time: [id number] PT Time Calculation (min): 19 min  PT Assessment / Plan / Recommendation History of Present Illness  80 yo BM PMHx Salivary Duct Stone, Renal Failure, Asthma, S/P partial Lung Resection, Alzheimer Dementia. Per wife she found patient in the garage without cloths, and contacted EMS Presented ED department via EMS with cough and fever. Patient's also had some shortness of breath. Recently treated for pneumonia as an outpatient with an unknown antibiotic. 05/01/2013 RN reports increased agitation overnight with patient attempted to the bed, Pull out IV lines. RN reports that a one-time dose of haloperidol calmed Pt down and allowed him to rest comfortably. Page 2 step down unit secondary to patient becoming increasingly agitated attempted to bite RN, and managed to kick another RN prior to being restrained. Upon arrival patient appears to be back to his baseline from yesterday of being pleasantly confused, most likely haloperidol now taking effect. Patient has agreed to eat breakfast, and has stopped cutting the medical staff. 05/02/2013 patient sleepy but cooperative did allow exam. 05/03/2013 patient pleasant this a.m., cooperate with all commands, allows exam. Most likely secondary to Haldol taking effect. Patient expresses his hunger this a.m.  Clinical Impression  Pt admitted with above. Pt currently with functional limitations due to the deficits listed below (see PT Problem List).  Pt will benefit from skilled PT to increase their independence and safety with mobility to allow discharge to the venue listed below. Pt poor historian and only orientated to name however agreeable to OOB to chair today.     PT Assessment  Patient needs continued PT services    Follow Up Recommendations  SNF    Does the patient have the potential to tolerate intense rehabilitation       Barriers to Discharge        Equipment Recommendations  Other (comment) (unknown home DME -may need RW, w/c- TBD)    Recommendations for Other Services     Frequency Min 2X/week    Precautions / Restrictions Precautions Precautions: Fall   Pertinent Vitals/Pain No c/o of pain, pt does not like cold sensation - given warm blankets end of session      Mobility  Bed Mobility Overal bed mobility: Needs Assistance Bed Mobility: Supine to Sit Supine to sit: Max assist;+2 for physical assistance;HOB elevated General bed mobility comments: pt able to initiate moving legs to EOB however still required assist for upper and lower body Transfers Overall transfer level: Needs assistance Equipment used: 2 person hand held assist Transfers: Sit to/from BJ'sStand;Stand Pivot Transfers Sit to Stand: Max assist;+2 physical assistance Stand pivot transfers: Max assist;+2 physical assistance General transfer comment: pt given 2 [facility], assist for weakness, stood again to remove BSC and place recliner behind pt    Exercises     PT Diagnosis: Difficulty walking;Generalized weakness  PT Problem List: Decreased strength;Decreased activity tolerance;Decreased mobility;Decreased range of motion;Decreased cognition PT Treatment Interventions: DME instruction;Gait training;Stair training;Functional mobility training;Therapeutic activities;Therapeutic exercise;Patient/family education;Neuromuscular re-education;Balance training     PT Goals(Current goals can be found in the care plan section) Acute Rehab PT Goals PT Goal Formulation: Patient unable to participate in goal setting Time For Goal Achievement: 05/18/13 Potential to Achieve Goals: Fair  Visit Information  Last PT Received On: 05/04/13 Assistance Needed: +2 History of Present Illness: 80 yo BM PMHx Salivary Duct Stone, Renal Failure, Asthma, S/P partial Lung Resection, Alzheimer Dementia.  Per wife she found patient in the  garage without cloths, and contacted EMS Presented ED department via EMS with cough and fever. Patient's also had some shortness of breath. Recently treated for pneumonia as an outpatient with an unknown antibiotic. 05/01/2013 RN reports increased agitation overnight with patient attempted to the bed, Pull out IV lines. RN reports that a one-time dose of haloperidol calmed Pt down and allowed him to rest comfortably. Page 2 step down unit secondary to patient becoming increasingly agitated attempted to bite RN, and managed to kick another RN prior to being restrained. Upon arrival patient appears to be back to his baseline from yesterday of being pleasantly confused, most likely haloperidol now taking effect. Patient has agreed to eat breakfast, and has stopped cutting the medical staff. 05/02/2013 patient sleepy but cooperative did allow exam. 05/03/2013 patient pleasant this a.m., cooperate with all commands, allows exam. Most likely secondary to Haldol taking effect. Patient expresses his hunger this a.m.       Prior Functioning  Home Living Additional Comments: pt poor historian, per chart from home with spouse Prior Function Comments: unknown - pt poor historian Communication Communication: No difficulties    Cognition  Cognition Arousal/Alertness: Awake/alert Behavior During Therapy: WFL for tasks assessed/performed Overall Cognitive Status: History of cognitive impairments - at baseline (not orientated to place, situation, time)    Extremity/Trunk Assessment Lower Extremity Assessment Lower Extremity Assessment: Generalized weakness;RLE deficits/detail;LLE deficits/detail RLE Deficits / Details: lacking full knee extension actively and passively LLE Deficits / Details: lacking full knee extension actively and passively   Balance Balance Overall balance assessment: Needs assistance Sitting-balance support: Feet supported Sitting balance-Leahy Scale: Poor  End of Session PT - End of  Session Equipment Utilized During Treatment: Oxygen Activity Tolerance: Patient tolerated treatment well Patient left: in chair;with call bell/phone within reach;with chair alarm set Nurse Communication: Mobility status (+2)  GP     Cyd Hostler,KATHrine E 05/04/2013, 2:45 PM Zenovia Jarred, PT, DPT 05/04/2013 Pager: [phone]

## 2013-05-04 NOTE — Progress Notes (Signed)
[facility] PROGRESS NOTE  Ayomide Purdy RKY:[id number] DOB: 1917/04/13 DOA: 04/30/2013 PCP: Default, Provider, MD  Assessment/Plan: CAP  -DC'd Levofloxacin per pharmacy secondary to risk for QT prolongation - currently on ceftriaxone+ azithromycin -Continue DuoNeb as meeded -Continue Solu-Medrol  -Continue flutter valve during waking hours -Respiratory virus panel; see for results below indices note RSV B positive  -Strep pneumonia urinary antigen negative -Continue hydration as tolerated  COPD  -See CAP   HTN  -Most likely secondary to patient's pneumonia which has been causing him episodes of hypoxemia  -Could also be secondary to untreated primary HTN.  -Patient continues to refuse PO labetalol 100 mg  BID per staff -Continue  hydralazine 10 mg  PRN  SBP> 165 or DBP> 100 -Continue labetalol 10 mg PRN SBP> 165 or DBP> 100 -Continue Lisinopril 2.5 mg -NOTE; patient will be intermittently take medication from medical staff. Which makes control of his HTN/pulmonary hypertension difficult at best. Patient will take medication if wife or daughter present. This problem will need to be addressed prior to patient's discharge (SNF vs H.H), sufficient help to maintain patient on home?  Anemia  -Most likely chronic will monitor at this time and if appears to trend down will perform workup -Stable 1/6   Alzheimer's dementia -Continue home medication -Continues to have agitation overnight will continue haloperidol 5 mg IV  QID.   CHF -Elevated proBNP=  1756, Echocardiogram; most likely has  Diastolic CHF however echocardiogram not diagnostic. However is diagnostic for pulmonary hypertension; see below for results  Pulmonary hypertension -Control patient's HTN, see above  Code Status: Full Family Communication:  Pt in room Plan for discharge: Recs for SNF   Consultants:   Procedure Echocardiogram 05/02/2013 - Left ventricle:moderate concentric hypertrophy. Systolic  function was vigorous.  LVEF= 65% to 70%. Wall motion was normal; there were no regional wall motion abnormalities.  -The study is not technically sufficient to allow evaluation of LV diastolic function. - Aortic valve: Moderately calcified valve. Trileaflet. Adequate aortic continuous doppler signals were not obtained; may be mild aortic stenosis,  - Left atrium: The atrium was mildly dilated. - Right ventricle: RV systolic pressure: 31DV Hg (S, est). - Right atrium: The atrium was mildly dilated. - Tricuspid valve: Moderate regurgitation. - Systemic veins: The IVC was not visualized. - Pericardium, extracardiac: There was no pericardial effusion.     Respiratory virus panel 04/30/2013 Respiratory Syncytial Virus A NOT DETECTED Respiratory Syncytial Virus B (Abnormal) DETECTED  Influenza A NOT DETECTED Influenza B NOT DETECTED  Parainfluenza 1 NOT DETECTED  Parainfluenza 2 NOT DETECTED  Parainfluenza 3 NOT DETECTED  Metapneumovirus NOT DETECTED  Rhinovirus NOT DETECTED  Adenovirus NOT DETECTED  Influenza A H1 NOT DETECTED  Influenza A H3 NOT DETECTED   MRSA PCR screening 04/30/2013; negative   CXR 04/30/2013  Emphysematous changes with prominent parenchymal densities at the  right lung base. The right basilar densities are slightly more  conspicuous than the previous examination and difficult to exclude  acute on chronic disease.    Antibiotics  Levofloxacin 1/3>>  HPI/Subjective: Ivan Young 80 yo BM PMHx Salivary Duct Stone, Renal Failure, Asthma, S/P partial Lung Resection, Alzheimer Dementia. Per wife she found patient in the garage without cloths, and contacted EMS Presented ED department via EMS with cough and fever. Patient's also had some shortness of breath. Recently treated for pneumonia as an outpatient with an unknown antibiotic. 05/01/2013 RN reports increased agitation overnight with patient attempted to the bed, Pull out IV lines. RN  reports that a one-time dose  of haloperidol calmed Pt down and allowed him to rest comfortably. Page 2 step down unit secondary to patient becoming increasingly agitated attempted to bite RN, and managed to kick another RN prior to being restrained. Upon arrival patient appears to be back to his baseline from yesterday of being pleasantly confused, most likely haloperidol now taking effect. Patient has agreed to eat breakfast, and has stopped cutting the medical staff. 05/02/2013 patient sleepy but cooperative did allow exam. 05/03/2013 patient pleasant this a.m., cooperate with all commands, allows exam. Most likely secondary to Haldol taking effect. Patient expresses his hunger this a.m.  No acute events noted overnight. Pt without complaints.  Objective: Filed Vitals:   05/03/13 2141 05/04/13 0614 05/04/13 0749 05/04/13 1315  BP: 167/89 176/90  182/101  Pulse: 98 72  87  Temp: 97.8 F (36.6 C) 97.7 F (36.5 C)  97.7 F (36.5 C)  TempSrc: Oral Oral  Oral  Resp:    18  Height:      Weight:  55.021 kg (121 lb 4.8 oz)    SpO2: 94% 93% 93% 94%    Intake/Output Summary (Last 24 hours) at 05/04/13 1333 Last data filed at 05/04/13 1321  Gross per 24 hour  Intake 3604.17 ml  Output   3200 ml  Net 404.17 ml   Filed Weights   05/02/13 0700 05/03/13 1300 05/04/13 0614  Weight: 55.702 kg (122 lb 12.8 oz) 56 kg (123 lb 7.3 oz) 55.021 kg (121 lb 4.8 oz)    Exam:   General: A./O. x1, in NAD, follows commands, except for taking oral medication for allowing breathing treatments unless family members present  Cardiovascular: Regular rhythm, rate, negative murmurs rubs or gallops, DP/PT pulse one plus bilateral  Respiratory: Mild bilateral rhonchi Rt > Lt foot to see significantly improved from 1/5), negative wheezing   Abdomen: Soft, nontender, nondistended, plus bowel sounds  Musculoskeletal: Negative pedal edema negative cyanosis   Data Reviewed: Basic Metabolic Panel:  Recent Labs Lab 04/30/13 1219  05/01/13 0346 05/02/13 0342 05/03/13 0450 05/04/13 0430  NA 139 138 140 142 139  K 4.4 3.8 3.6* 3.2* 3.6*  CL 101 102 104 103 101  CO2 25 24 22 24 23   GLUCOSE 91 125* 103* 101* 107*  BUN 10 11 16 14 17   CREATININE 0.61 0.57 0.55 0.48* 0.54  CALCIUM 8.3* 8.1* 8.3* 8.6 8.4  MG  --  1.9 2.0 2.1 2.1   Liver Function Tests:  Recent Labs Lab 04/30/13 1219 05/01/13 0346 05/02/13 0342 05/03/13 0450 05/04/13 0430  AST 22 17 25  38* 42*  ALT 8 7 7 10 13   ALKPHOS 77 70 68 72 66  BILITOT 0.4 0.3 0.3 0.3 0.3  PROT 7.2 6.7 6.5 7.0 6.7  ALBUMIN 3.0* 2.7* 2.6* 2.8* 2.7*   No results found for this basename: LIPASE, AMYLASE,  in the last 168 hours No results found for this basename: AMMONIA,  in the last 168 hours CBC:  Recent Labs Lab 04/30/13 1219 05/01/13 0346 05/02/13 0342 05/03/13 0450 05/04/13 0430  WBC 6.5 7.1 9.1 8.9 8.2  NEUTROABS 5.5 6.5 8.6* 8.1* 7.5  HGB 11.9* 10.9* 11.3* 13.1 12.9*  HCT 36.0* 33.2* 34.0* 38.6* 38.3*  MCV 89.6 89.7 89.5 87.5 87.6  PLT 174 156 157 205 211   Cardiac Enzymes:  Recent Labs Lab 04/30/13 1802  TROPONINI <0.30   BNP (last 3 results)  Recent Labs  04/30/13 1713 04/30/13 1802  PROBNP 1891.0*  1756.0*   CBG: No results found for this basename: GLUCAP,  in the last 168 hours  Recent Results (from the past 240 hour(s))  MRSA PCR SCREENING     Status: None   Collection Time    04/30/13  4:30 PM      Result Value Range Status   MRSA by PCR NEGATIVE  NEGATIVE Final   Comment:            The GeneXpert MRSA Assay (FDA     approved for NASAL specimens     only), is one component of a     comprehensive MRSA colonization     surveillance program. It is not     intended to diagnose MRSA     infection nor to guide or     monitor treatment for     MRSA infections.  CULTURE, BLOOD (ROUTINE X 2)     Status: None   Collection Time    04/30/13  5:08 PM      Result Value Range Status   Specimen Description BLOOD RIGHT ARM   Final    Special Requests BOTTLES DRAWN AEROBIC AND ANAEROBIC [facility]   Final   Culture  Setup Time     Final   Value: 04/30/2013 20:38     Performed at [facility]   Culture     Final   Value:        BLOOD CULTURE RECEIVED NO GROWTH TO DATE CULTURE WILL BE HELD FOR 5 DAYS BEFORE ISSUING A FINAL NEGATIVE REPORT     Performed at [facility]   Report Status PENDING   Incomplete  CULTURE, BLOOD (ROUTINE X 2)     Status: None   Collection Time    04/30/13  5:13 PM      Result Value Range Status   Specimen Description BLOOD LEFT ARM   Final   Special Requests BOTTLES DRAWN AEROBIC ONLY 5CC   Final   Culture  Setup Time     Final   Value: 04/30/2013 20:38     Performed at [facility]   Culture     Final   Value:        BLOOD CULTURE RECEIVED NO GROWTH TO DATE CULTURE WILL BE HELD FOR 5 DAYS BEFORE ISSUING A FINAL NEGATIVE REPORT     Performed at [facility]   Report Status PENDING   Incomplete  RESPIRATORY VIRUS PANEL     Status: Abnormal   Collection Time    04/30/13 10:47 PM      Result Value Range Status   Source - RVPAN NASOPHARYNGEAL   Final   Respiratory Syncytial Virus A NOT DETECTED   Final   Respiratory Syncytial Virus B DETECTED (*)  Final   Influenza A NOT DETECTED   Final   Influenza B NOT DETECTED   Final   Parainfluenza 1 NOT DETECTED   Final   Parainfluenza 2 NOT DETECTED   Final   Parainfluenza 3 NOT DETECTED   Final   Metapneumovirus NOT DETECTED   Final   Rhinovirus NOT DETECTED   Final   Adenovirus NOT DETECTED   Final   Influenza A H1 NOT DETECTED   Final   Influenza A H3 NOT DETECTED   Final   Comment: (NOTE)           Normal Reference Range for each Analyte: NOT DETECTED     Testing performed using the Luminex xTAG Respiratory Viral Panel  test     kit.     This test was developed and its performance characteristics determined     by [facility]. It has not been cleared or approved by the [place]     Food and Drug  Administration. This test is used for clinical purposes.     It should not be regarded as investigational or for research. This     laboratory is certified under the Learned (CLIA) as qualified to perform high complexity     clinical laboratory testing.     Performed at [facility]     Studies: No results found.  Scheduled Meds: . antiseptic oral rinse  15 mL Mouth Rinse q12n4p  . azithromycin  500 mg Intravenous Q24H  . cefTRIAXone (ROCEPHIN)  IV  1 g Intravenous Q24H  . chlorhexidine  15 mL Mouth Rinse BID  . dextromethorphan-guaiFENesin  1 tablet Oral BID  . donepezil  5 mg Oral QHS  . enoxaparin (LOVENOX) injection  40 mg Subcutaneous Q24H  . haloperidol lactate  5 mg Intravenous QID  . ipratropium-albuterol  3 mL Nebulization QID  . labetalol  100 mg Oral BID  . lisinopril  2.5 mg Oral Daily  . methylPREDNISolone (SOLU-MEDROL) injection  60 mg Intravenous Q24H   Continuous Infusions: . sodium chloride 125 mL/hr at 05/04/13 9747    Active Problems:   COPD   Renal failure   Community acquired pneumonia   CAP (community acquired pneumonia)   HTN (hypertension)   Anemia   Acute bronchiolitis due to respiratory syncytial virus (RSV)   Hypokalemia   Pulmonary hypertension  Time spent: 40 min  Nature Kueker K  [facility] Pager [phone]. If 7PM-7AM, please contact night-coverage at [email or web address], password [facility] 05/04/2013, 1:33 PM  LOS: 4 days

## 2013-05-05 LAB — MAGNESIUM: Magnesium: 2.1 mg/dL (ref 1.5–2.5)

## 2013-05-05 LAB — CBC WITH DIFFERENTIAL/PLATELET
BASOS ABS: 0 10*3/uL (ref 0.0–0.1)
Basophils Relative: 0 % (ref 0–1)
EOS ABS: 0 10*3/uL (ref 0.0–0.7)
EOS PCT: 0 % (ref 0–5)
HEMATOCRIT: 38.3 % — AB (ref 39.0–52.0)
Hemoglobin: 12.9 g/dL — ABNORMAL LOW (ref 13.0–17.0)
LYMPHS PCT: 3 % — AB (ref 12–46)
Lymphs Abs: 0.3 10*3/uL — ABNORMAL LOW (ref 0.7–4.0)
MCH: 29.5 pg (ref 26.0–34.0)
MCHC: 33.7 g/dL (ref 30.0–36.0)
MCV: 87.6 fL (ref 78.0–100.0)
MONO ABS: 0.3 10*3/uL (ref 0.1–1.0)
Monocytes Relative: 3 % (ref 3–12)
Neutro Abs: 7.9 10*3/uL — ABNORMAL HIGH (ref 1.7–7.7)
Neutrophils Relative %: 94 % — ABNORMAL HIGH (ref 43–77)
Platelets: 208 10*3/uL (ref 150–400)
RBC: 4.37 MIL/uL (ref 4.22–5.81)
RDW: 15 % (ref 11.5–15.5)
WBC: 8.5 10*3/uL (ref 4.0–10.5)

## 2013-05-05 LAB — COMPREHENSIVE METABOLIC PANEL
ALBUMIN: 2.7 g/dL — AB (ref 3.5–5.2)
ALT: 13 U/L (ref 0–53)
AST: 32 U/L (ref 0–37)
Alkaline Phosphatase: 65 U/L (ref 39–117)
BUN: 21 mg/dL (ref 6–23)
CO2: 24 meq/L (ref 19–32)
Calcium: 8.5 mg/dL (ref 8.4–10.5)
Chloride: 104 mEq/L (ref 96–112)
Creatinine, Ser: 0.68 mg/dL (ref 0.50–1.35)
GFR calc Af Amer: >90 mL/min (ref >90)
GFR calc non Af Amer: 79 mL/min — ABNORMAL LOW (ref >90)
Glucose, Bld: 109 mg/dL — ABNORMAL HIGH (ref 70–99)
POTASSIUM: 3.9 meq/L (ref 3.7–5.3)
Sodium: 142 mEq/L (ref 137–147)
Total Bilirubin: 0.4 mg/dL (ref 0.3–1.2)
Total Protein: 6.4 g/dL (ref 6.0–8.3)

## 2013-05-05 MED ORDER — INFLUENZA VAC SPLIT QUAD 0.5 ML IM SUSP: 0.5000 mL | INTRAMUSCULAR | Status: DC

## 2013-05-05 MED ORDER — INFLUENZA VAC SPLIT QUAD 0.5 ML IM SUSP
0.5000 mL | INTRAMUSCULAR | Status: AC | PRN
  Administered 2013-05-05: 0.5 mL via INTRAMUSCULAR

## 2013-05-05 MED ORDER — LABETALOL HCL 100 MG PO TABS: 100.0000 mg | ORAL_TABLET | Freq: Two times a day (BID) | ORAL | Status: AC

## 2013-05-05 MED ORDER — LISINOPRIL 2.5 MG PO TABS: 2.5000 mg | ORAL_TABLET | Freq: Every day | ORAL | Status: AC

## 2013-05-05 MED ORDER — AZITHROMYCIN 500 MG PO TABS: 500.0000 mg | ORAL_TABLET | Freq: Every day | ORAL | Status: AC

## 2013-05-05 MED ORDER — CEFPODOXIME PROXETIL 100 MG PO TABS: 100.0000 mg | ORAL_TABLET | Freq: Two times a day (BID) | ORAL | Status: AC

## 2013-05-05 MED ORDER — PREDNISONE 5 MG PO TABS: 5.0000 mg | ORAL_TABLET | Freq: Every day | ORAL | Status: AC

## 2013-05-05 NOTE — Progress Notes (Signed)
[facility] is set to [facility] today. Patient & wife at bedside aware. [place]. [facility] called for 2:30p pickup (Service Request Id: [id number]).   Clinical Social Work Department CLINICAL SOCIAL WORK PLACEMENT NOTE 05/05/2013  Patient:  [name],Ivan  Account Number:  [id number] Admit date:  04/30/2013  Clinical Social Worker:  Orpah GreekKELLY FOLEY, LCSWA  Date/time:  05/05/2013 01:45 PM  Clinical Social Work is seeking post-discharge placement for this patient at the following level of care:   SKILLED NURSING   (*CSW will update this form in Epic as items are completed)   05/05/2013  Patient/family provided with [facility] Department of Clinical Social Work's list of facilities offering this level of care within the geographic area requested by the patient (or if unable, by the patient's family).  05/05/2013  Patient/family informed of their freedom to choose among providers that offer the needed level of care, that participate in Medicare, Medicaid or managed care program needed by the patient, have an available bed and are willing to accept the patient.  05/05/2013  Patient/family informed of [facility]' ownership interest in [facility], as well as of the fact that they are under no obligation to receive care at this facility.  PASARR submitted to EDS on 05/05/2013 PASARR number received from EDS on 05/05/2013  FL2 transmitted to all facilities in geographic area requested by pt/family on  05/05/2013 FL2 transmitted to all facilities within larger geographic area on   Patient informed that his/her managed care company has contracts with or will negotiate with  certain facilities, including the following:     Patient/family informed of bed offers received:  05/05/2013 [facility] Physician recommends and patient chooses bed at    [facility] on   05/05/2013 Patient to be transferred to facility by [facility]  The following physician request were entered in Epic:   Additional Comments:   Unice BaileyKelly Foley, LCSW [facility] Clinical Social Worker cell #: [phone]

## 2013-05-05 NOTE — Evaluation (Signed)
Occupational Therapy Evaluation Patient Details Name: Ivan [name] MRN: [id number] DOB: 12/30/1916 Today's Date: 05/05/2013 Time: 9416-5081 OT Time Calculation (min): 25 min  OT Assessment / Plan / Recommendation History of present illness 80 yo BM PMHx Salivary Duct Stone, Renal Failure, Asthma, S/P partial Lung Resection, Alzheimer Dementia. Per wife she found patient in the garage without cloths, and contacted EMS Presented ED department via EMS with cough and fever. Patient's also had some shortness of breath. Recently treated for pneumonia as an outpatient with an unknown antibiotic. 05/01/2013 RN reports increased agitation overnight with patient attempted to the bed, Pull out IV lines. RN reports that a one-time dose of haloperidol calmed Pt down and allowed him to rest comfortably. Page 2 step down unit secondary to patient becoming increasingly agitated attempted to bite RN, and managed to kick another RN prior to being restrained. Upon arrival patient appears to be back to his baseline from yesterday of being pleasantly confused, most likely haloperidol now taking effect. Patient has agreed to eat breakfast, and has stopped cutting the medical staff. 05/02/2013 patient sleepy but cooperative did allow exam. 05/03/2013 patient pleasant this a.m., cooperate with all commands, allows exam. Most likely secondary to Haldol taking effect. Patient expresses his hunger this a.m.   Clinical Impression   Pt with lethargy today due to medications given for agitation.  Pt with baseline dementia who was dependent in all ADL except eating prior to admission.  Pt requires +2 assist for all mobility.  He will need SNF for rehab.  Will defer OT to SNF.    OT Assessment  All further OT needs can be met in the next venue of care    Follow Up Recommendations  SNF    Barriers to Discharge      Equipment Recommendations  None recommended by OT    Recommendations for Other Services    Frequency        Precautions / Restrictions Precautions Precautions: Fall Restrictions Weight Bearing Restrictions: No   Pertinent Vitals/Pain No indicators of pain, VSS.    ADL  Eating/Feeding: +1 Total assistance Where Assessed - Eating/Feeding: Bed level Grooming: Maximal assistance;Wash/dry hands Where Assessed - Grooming: Supine, head of bed up Upper Body Bathing: +1 Total assistance Where Assessed - Upper Body Bathing: Supine, head of bed up;Rolling right and/or left Lower Body Bathing: +1 Total assistance Where Assessed - Lower Body Bathing: Supine, head of bed up;Rolling right and/or left Upper Body Dressing: +1 Total assistance Where Assessed - Upper Body Dressing: Supine, head of bed up Lower Body Dressing: +1 Total assistance Where Assessed - Lower Body Dressing: Supine, head of bed up;Rolling right and/or left Transfers/Ambulation Related to ADLs: pt with lethargy, limited participation for transfers ADL Comments: Pt medicated due to agitation. Pt is dependent in all ADL except eating.    OT Diagnosis: Generalized weakness;Cognitive deficits;Altered mental status  OT Problem List: Decreased strength;Decreased activity tolerance;Decreased range of motion;Impaired balance (sitting and/or standing);Decreased cognition;Decreased safety awareness;Decreased knowledge of use of DME or AE;Impaired UE functional use OT Treatment Interventions:     OT Goals(Current goals can be found in the care plan section) Acute Rehab OT Goals Patient Stated Goal: Wife agreeable to SNF, realizes she can't manage him at home.  Visit Information  Last OT Received On: 05/05/13 Assistance Needed: +2 History of Present Illness: 80 yo BM PMHx Salivary Duct Stone, Renal Failure, Asthma, S/P partial Lung Resection, Alzheimer Dementia. Per wife she found patient in the garage without cloths, and contacted  EMS Presented ED department via EMS with cough and fever. Patient's also had some shortness of breath. Recently  treated for pneumonia as an outpatient with an unknown antibiotic. 05/01/2013 RN reports increased agitation overnight with patient attempted to the bed, Pull out IV lines. RN reports that a one-time dose of haloperidol calmed Pt down and allowed him to rest comfortably. Page 2 step down unit secondary to patient becoming increasingly agitated attempted to bite RN, and managed to kick another RN prior to being restrained. Upon arrival patient appears to be back to his baseline from yesterday of being pleasantly confused, most likely haloperidol now taking effect. Patient has agreed to eat breakfast, and has stopped cutting the medical staff. 05/02/2013 patient sleepy but cooperative did allow exam. 05/03/2013 patient pleasant this a.m., cooperate with all commands, allows exam. Most likely secondary to Haldol taking effect. Patient expresses his hunger this a.m.       Prior [place] expects to be discharged to:: Private residence Living Arrangements: Spouse/significant other Available Help at Discharge: Family;Available 24 hours/day Type of Home: House Home Access: Stairs to enter [employer] of Steps: 4 Home Layout: One level Home Equipment: Walker - 2 wheels;Shower seat;Bedside commode (only uses shower seat) Additional Comments: from home with spouse, pt with advanced dementia Prior Function Level of Independence: Needs assistance Gait / Transfers Assistance Needed: wife supervised walking, pt would not use walker ADL's / Homemaking Assistance Needed: Pt self fed with set up, wife bathed, dressed and groomed pt Communication Communication: No difficulties Dominant Hand: Right         Vision/Perception     Cognition  Cognition Arousal/Alertness: Lethargic;Suspect due to medications Behavior During Therapy: Agitated Overall Cognitive Status: History of cognitive impairments - at baseline (pt is more confused with periods of agitation)     Extremity/Trunk Assessment Upper Extremity Assessment Upper Extremity Assessment: Overall WFL for tasks assessed Lower Extremity Assessment Lower Extremity Assessment: Defer to PT evaluation RLE Deficits / Details: lacking full knee extension actively and passively LLE Deficits / Details: lacking full knee extension actively and passively     Mobility Bed Mobility Overal bed mobility: Needs Assistance;+2 for physical assistance Bed Mobility: Rolling Rolling: Max assist;+2 for physical assistance     Exercise     Balance     End of Session OT - End of Session Activity Tolerance: Patient limited by lethargy Patient left: in bed;with call bell/phone within reach;with bed alarm set;with family/visitor present (RT in room) Nurse Communication:  (PLOF)  GO     Malka So 05/05/2013, 11:00 AM [phone]

## 2013-05-05 NOTE — Progress Notes (Signed)
Clinical Social Work Department BRIEF PSYCHOSOCIAL ASSESSMENT 05/05/2013  Patient:  Ivan [name],Ivan [name]     Account Number:  [id number]     Admit date:  04/30/2013  Clinical Social Worker:  Orpah GreekFOLEY,Sahara Fujimoto, LCSWA  Date/Time:  05/05/2013 12:14 PM  Referred by:  Physician  Date Referred:  05/05/2013 Referred for  SNF Placement   Other Referral:   Interview type:  Patient Other interview type:   and wife at bedside    PSYCHOSOCIAL DATA Living Status:  WIFE Admitted from facility:   Level of care:   Primary support name:  Arlyss Queenrnestine Signor (husband) ph#: [phone] Primary support relationship to patient:  SPOUSE Degree of support available:   good    CURRENT CONCERNS Current Concerns  Post-Acute Placement   Other Concerns:    SOCIAL WORK ASSESSMENT / PLAN CSW received consult for SNF placmement.   Assessment/plan status:  [facility] to [facility] Other assessment/ plan:   Information/referral to community resources:   CSW completed FL2 and [facility] - provided bed offers and [facility].    PATIENT'S/FAMILY'S RESPONSE TO PLAN OF CARE: Patient's wife is agreeable with plan for SNF, states that [facility] to both her and her daughter.       Unice BaileyKelly Foley, LCSW [facility] Clinical Social Worker cell #: [phone]

## 2013-05-05 NOTE — Progress Notes (Signed)
Report called to A. Humphrey RN at [facility].

## 2013-05-05 NOTE — Discharge Summary (Signed)
Physician Discharge Summary  Ivan [name] UDJ:[id number] DOB: [birth year] DOA: 04/30/2013  PCP: Default, Provider, MD  Admit date: 04/30/2013 Discharge date: 05/05/2013  Time spent: 35 minutes  Disposition: SNF  Recommendations for Outpatient Follow-up:  1. Follow up with PCP in 1-2 weeks  Discharge Diagnoses:  Active Problems:   COPD   Renal failure   Community acquired pneumonia   CAP (community acquired pneumonia)   HTN (hypertension)   Anemia   Acute bronchiolitis due to respiratory syncytial virus (RSV)   Hypokalemia   Pulmonary hypertension   Discharge Condition: Improved  Diet recommendation: Regular  Filed Weights   05/03/13 1300 05/04/13 0614 05/05/13 0524  Weight: 56 kg (123 lb 7.3 oz) 55.021 kg (121 lb 4.8 oz) 57.5 kg (126 lb 12.2 oz)    History of present illness:  Ivan [name] 80 yo BM PMHx Salivary Duct Stone, Renal Failure, Asthma, S/P partial Lung Resection, Alzheimer Dementia. Per wife she found patient in the garage without cloths, and contacted EMS Presented ED department via EMS with cough and fever. Patient's also had some shortness of breath. Recently treated for pneumonia as an outpatient with an unknown antibiotic.   Hospital Course:  CAP  -DC'd Levofloxacin per pharmacy secondary to risk for QT prolongation  - Continued on ceftriaxone+ azithromycin with plans to complete a course of PO antibiotics on discharge -Continued DuoNeb as meeded  -Continued on Solu-Medrol, with plans to wean steroids as an outpatient  -Continue flutter valve during waking hours  -Respiratory virus panel; see for results below indices note RSV B positive  -Strep pneumonia urinary antigen negative  -Continue hydration as tolerated  COPD  -See CAP   HTN  -Most likely secondary to patient's pneumonia which has been causing him episodes of hypoxemia  - BP remained otherwise labile, depending on if the patient took PO meds, however, pt was noted to intermittently refuse PO  BP meds. Anemia  -Most likely chronic will monitor at this time and if appears to trend down will perform workup  -Stable Alzheimer's dementia  -Continue home medication  -Pt was intermittently given  haloperidol 5 mg IV QID for aggitation CHF  -Elevated proBNP= 1756, Echocardiogram; most likely has Diastolic CHF however echocardiogram not diagnostic. However is diagnostic for pulmonary hypertension;  Pulmonary hypertension  -Control patient's HTN, see above  Discharge Exam: Filed Vitals:   05/04/13 1615 05/04/13 2117 05/05/13 0524 05/05/13 1035  BP: 109/66 154/58 141/80   Pulse:  95 86   Temp:  98.3 F (36.8 C) 97.6 F (36.4 C)   TempSrc:  Axillary Axillary   Resp:  18 18 18   Height:      Weight:   57.5 kg (126 lb 12.2 oz)   SpO2:  95% 96%     General: Awake, in nad Cardiovascular: regular, s1, s2 Respiratory: normal resp effort, no wheezing  Discharge Instructions    Medication List         azithromycin 500 MG tablet  Commonly known as:  ZITHROMAX  Take 1 tablet (500 mg total) by mouth daily.     cefpodoxime 100 MG tablet  Commonly known as:  VANTIN  Take 1 tablet (100 mg total) by mouth 2 (two) times daily.     dextromethorphan-guaiFENesin 30-600 MG per 12 hr tablet  Commonly known as:  MUCINEX DM  Take 1 tablet by mouth every 12 (twelve) hours.     donepezil 5 MG tablet  Commonly known as:  ARICEPT  Take 5 mg by mouth  at bedtime.     labetalol 100 MG tablet  Commonly known as:  NORMODYNE  Take 1 tablet (100 mg total) by mouth 2 (two) times daily.     lisinopril 2.5 MG tablet  Commonly known as:  PRINIVIL,ZESTRIL  Take 1 tablet (2.5 mg total) by mouth daily.     predniSONE 5 MG tablet  Commonly known as:  DELTASONE  Take 1 tablet (5 mg total) by mouth daily with breakfast.       Allergies  Allergen Reactions  . Penicillins Anaphylaxis    Has tolerated Ceftriaxone.  . Droperidol     unknown  . Toradol [Ketorolac Tromethamine] Rash    Follow-up Information   Schedule an appointment as soon as possible for a visit with Follow up with PCP in 1-2 weeks.       The results of significant diagnostics from this hospitalization (including imaging, microbiology, ancillary and laboratory) are listed below for reference.    Significant Diagnostic Studies: Dg Chest 2 View  04/30/2013   CLINICAL DATA:  Fever and cough.  EXAM: CHEST  2 VIEW  COMPARISON:  09/24/2012 and CT 10/05/2011  FINDINGS: Two views of the chest demonstrate severe emphysematous changes in the upper lungs. Slightly increased parenchymal densities at the right lung base. Heart size is grossly stable. There is a stable partially calcified nodule in the right mid chest that roughly measures 1.2 cm. Again noted are compression fractures in the mid and lower thoracic spine which have not significantly changed.  IMPRESSION: Emphysematous changes with prominent parenchymal densities at the right lung base. The right basilar densities are slightly more conspicuous than the previous examination and difficult to exclude acute on chronic disease.   Electronically Signed   By: Markus Daft M.D.   On: 04/30/2013 12:20    Microbiology: Recent Results (from the past 240 hour(s))  MRSA PCR SCREENING     Status: None   Collection Time    04/30/13  4:30 PM      Result Value Range Status   MRSA by PCR NEGATIVE  NEGATIVE Final   Comment:            The GeneXpert MRSA Assay (FDA     approved for NASAL specimens     only), is one component of a     comprehensive MRSA colonization     surveillance program. It is not     intended to diagnose MRSA     infection nor to guide or     monitor treatment for     MRSA infections.  CULTURE, BLOOD (ROUTINE X 2)     Status: None   Collection Time    04/30/13  5:08 PM      Result Value Range Status   Specimen Description BLOOD RIGHT ARM   Final   Special Requests BOTTLES DRAWN AEROBIC AND ANAEROBIC [facility]   Final   Culture  Setup Time      Final   Value: 04/30/2013 20:38     Performed at [facility]   Culture     Final   Value:        BLOOD CULTURE RECEIVED NO GROWTH TO DATE CULTURE WILL BE HELD FOR 5 DAYS BEFORE ISSUING A FINAL NEGATIVE REPORT     Performed at [facility]   Report Status PENDING   Incomplete  CULTURE, BLOOD (ROUTINE X 2)     Status: None   Collection Time    04/30/13  5:13 PM  Result Value Range Status   Specimen Description BLOOD LEFT ARM   Final   Special Requests BOTTLES DRAWN AEROBIC ONLY 5CC   Final   Culture  Setup Time     Final   Value: 04/30/2013 20:38     Performed at [facility]   Culture     Final   Value:        BLOOD CULTURE RECEIVED NO GROWTH TO DATE CULTURE WILL BE HELD FOR 5 DAYS BEFORE ISSUING A FINAL NEGATIVE REPORT     Performed at [facility]   Report Status PENDING   Incomplete  RESPIRATORY VIRUS PANEL     Status: Abnormal   Collection Time    04/30/13 10:47 PM      Result Value Range Status   Source - RVPAN NASOPHARYNGEAL   Final   Respiratory Syncytial Virus A NOT DETECTED   Final   Respiratory Syncytial Virus B DETECTED (*)  Final   Influenza A NOT DETECTED   Final   Influenza B NOT DETECTED   Final   Parainfluenza 1 NOT DETECTED   Final   Parainfluenza 2 NOT DETECTED   Final   Parainfluenza 3 NOT DETECTED   Final   Metapneumovirus NOT DETECTED   Final   Rhinovirus NOT DETECTED   Final   Adenovirus NOT DETECTED   Final   Influenza A H1 NOT DETECTED   Final   Influenza A H3 NOT DETECTED   Final   Comment: (NOTE)           Normal Reference Range for each Analyte: NOT DETECTED     Testing performed using the Luminex xTAG Respiratory Viral Panel test     kit.     This test was developed and its performance characteristics determined     by [facility]. It has not been cleared or approved by the [place]     Food and Drug Administration. This test is used for clinical purposes.     It should not be regarded as investigational or  for research. This     laboratory is certified under the [place] (CLIA) as qualified to perform high complexity     clinical laboratory testing.     Performed at [facility]: Basic Metabolic Panel:  Recent Labs Lab 05/01/13 0346 05/02/13 0342 05/03/13 0450 05/04/13 0430 05/05/13 0433  NA 138 140 142 139 142  K 3.8 3.6* 3.2* 3.6* 3.9  CL 102 104 103 101 104  CO2 24 22 24 23 24   GLUCOSE 125* 103* 101* 107* 109*  BUN 11 16 14 17 21   CREATININE 0.57 0.55 0.48* 0.54 0.68  CALCIUM 8.1* 8.3* 8.6 8.4 8.5  MG 1.9 2.0 2.1 2.1 2.1   Liver Function Tests:  Recent Labs Lab 05/01/13 0346 05/02/13 0342 05/03/13 0450 05/04/13 0430 05/05/13 0433  AST 17 25 38* 42* 32  ALT 7 7 10 13 13   ALKPHOS 70 68 72 66 65  BILITOT 0.3 0.3 0.3 0.3 0.4  PROT 6.7 6.5 7.0 6.7 6.4  ALBUMIN 2.7* 2.6* 2.8* 2.7* 2.7*   No results found for this basename: LIPASE, AMYLASE,  in the last 168 hours No results found for this basename: AMMONIA,  in the last 168 hours CBC:  Recent Labs Lab 05/01/13 0346 05/02/13 0342 05/03/13 0450 05/04/13 0430 05/05/13 0433  WBC 7.1 9.1 8.9 8.2 8.5  NEUTROABS 6.5 8.6*  8.1* 7.5 7.9*  HGB 10.9* 11.3* 13.1 12.9* 12.9*  HCT 33.2* 34.0* 38.6* 38.3* 38.3*  MCV 89.7 89.5 87.5 87.6 87.6  PLT 156 157 205 211 208   Cardiac Enzymes:  Recent Labs Lab 04/30/13 1802  TROPONINI <0.30   BNP: BNP (last 3 results)  Recent Labs  04/30/13 1713 04/30/13 1802  PROBNP 1891.0* 1756.0*   CBG: No results found for this basename: GLUCAP,  in the last 168 hours   Signed:  Forrest Demuro K  [facility] 05/05/2013, 1:36 PM

## 2013-05-06 LAB — CULTURE, BLOOD (ROUTINE X 2)
CULTURE: NO GROWTH
Culture: NO GROWTH

## 2013-05-25 IMAGING — CT CT HEAD W/O CM
4 series · 16 of 30 positions shown, 18 images · non-contrast
Comparison: CT of the head performed 02/15/2010

CT HEAD

CLINICAL DATA: Status post fall; laceration behind the right ear.
Concern for head or cervical spine injury.

CT HEAD WITHOUT CONTRAST AND CT CERVICAL SPINE WITHOUT CONTRAST
TECHNIQUE: Multidetector CT imaging of the head and cervical spine
was performed following the standard protocol without intravenous
contrast.  Multiplanar CT image reconstructions of the cervical
spine were also generated.

[Series 3: c-spine st · axial · 0.28mm/px · z∈[-232,-212]mm · 2 of 88 slices shown]
[im 10/88  brain]
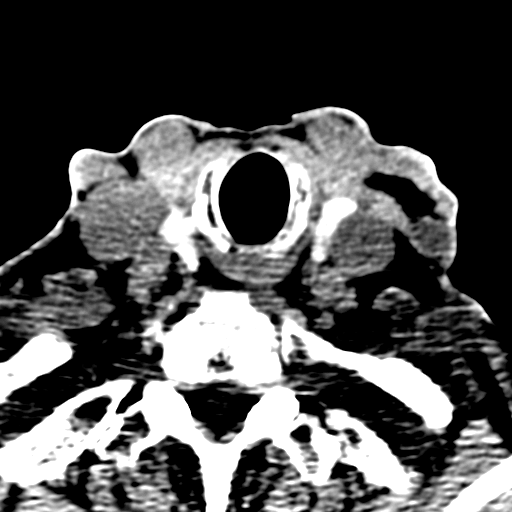
[im 20/88  brain]
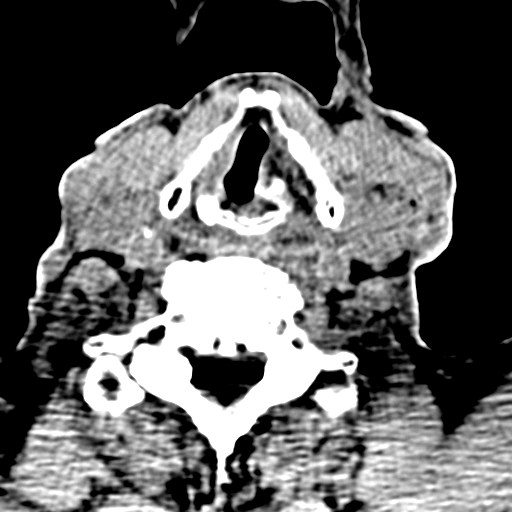

[Series 9: head w/o · axial · non-contrast · 0.44mm/px · z∈[-75,-25]mm · 2 of 30 slices shown]
[im 10/30  brain]
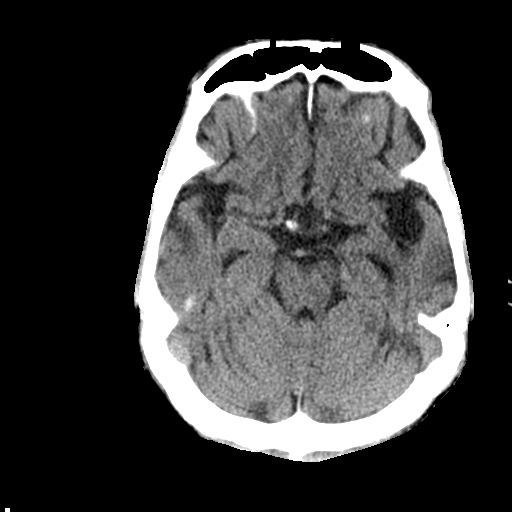
[im 20/30  brain]
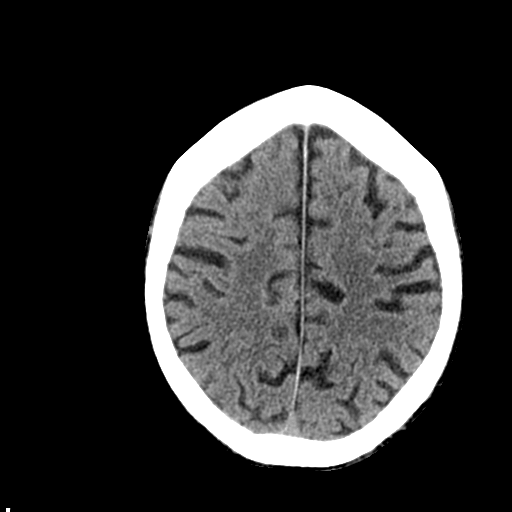

[Series 10: bone windows · axial · 0.44mm/px · z∈[-93,-6]mm · 4 of 49 slices shown]
[im 10/49  bone]
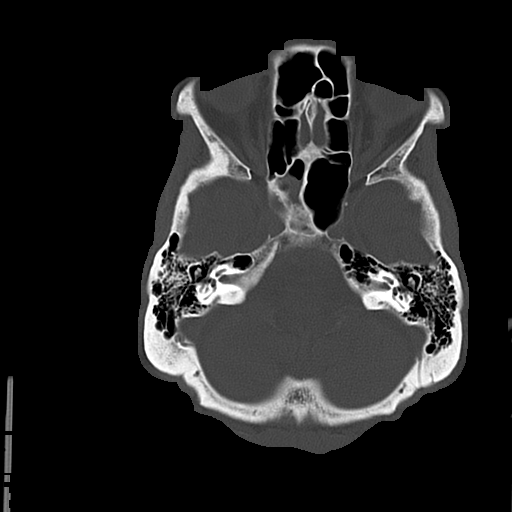
[im 20/49  bone]
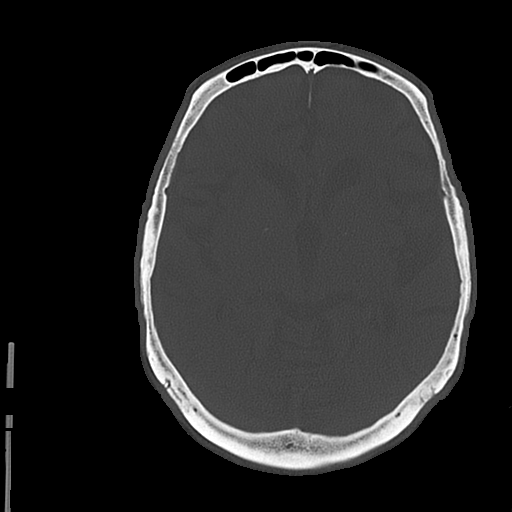
[im 29/49  bone]
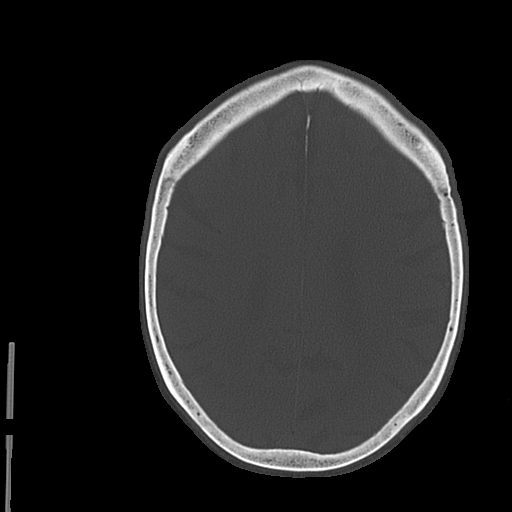
[im 39/49  bone]
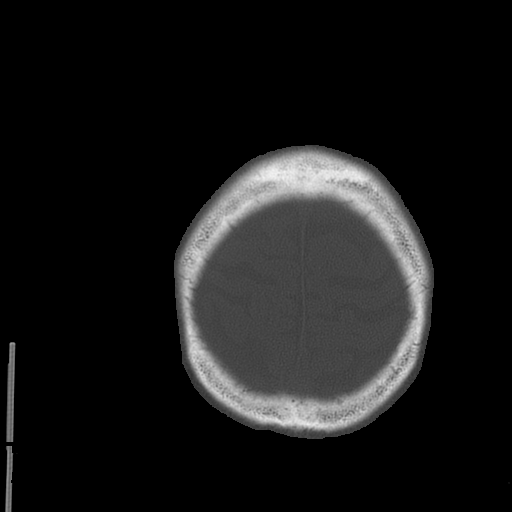

[Series 602: axial · axial · 0.34mm/px · z∈[-275,-151]mm · 8 of 87 slices shown, 10 images]
[im 10/87  brain]
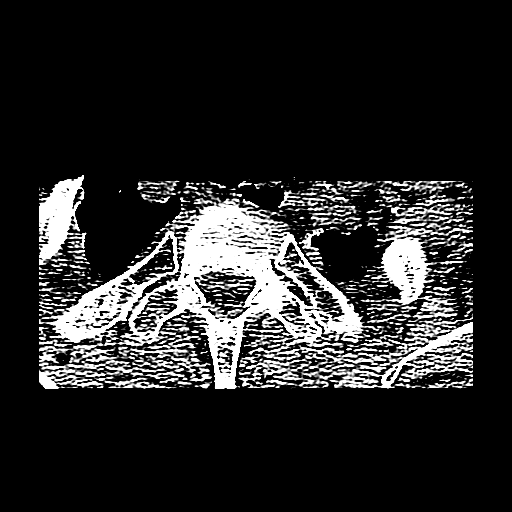
[im 10/87  bone]
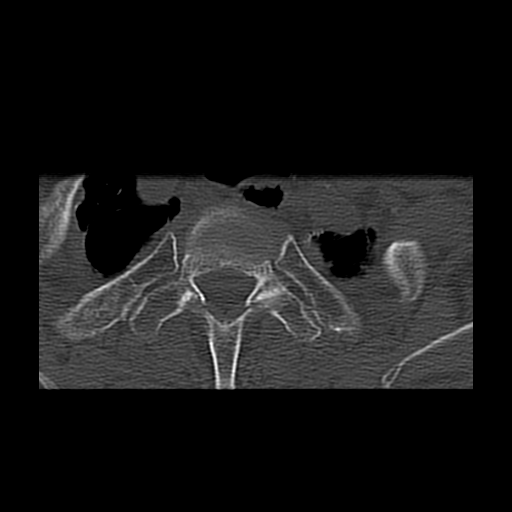
[im 20/87  brain]
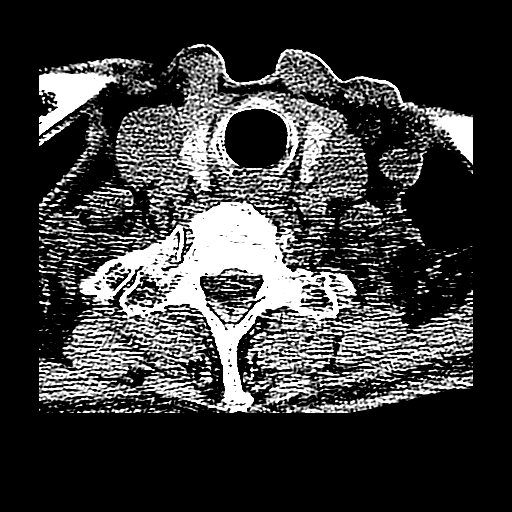
[im 29/87  brain]
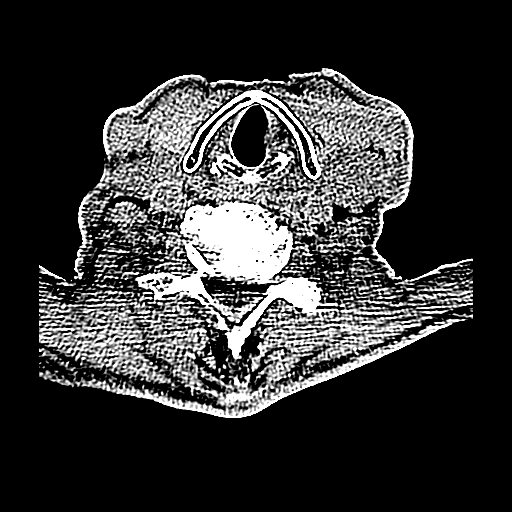
[im 39/87  brain]
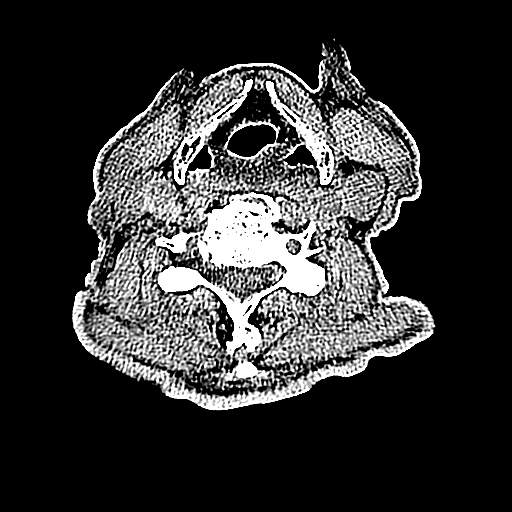
[im 48/87  brain]
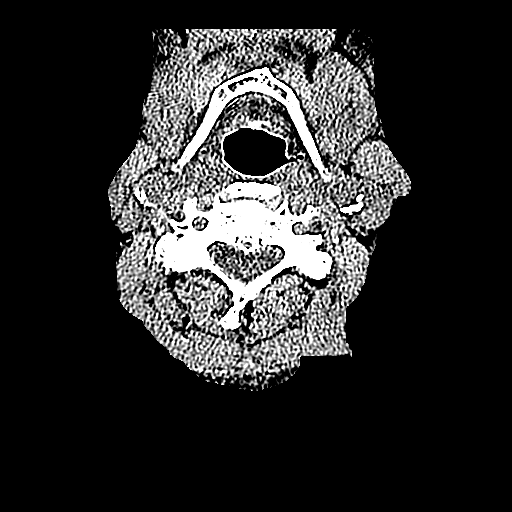
[im 48/87  bone]
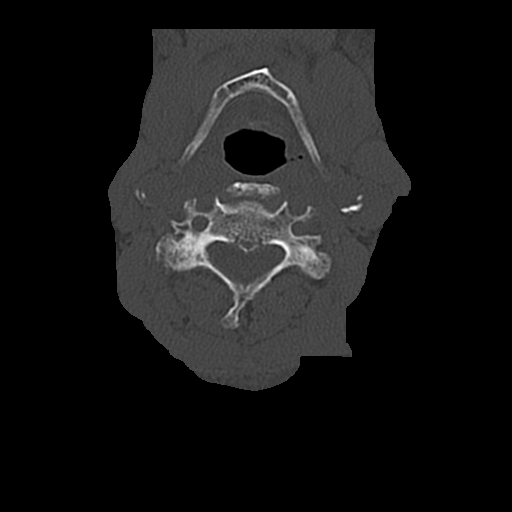
[im 58/87  brain]
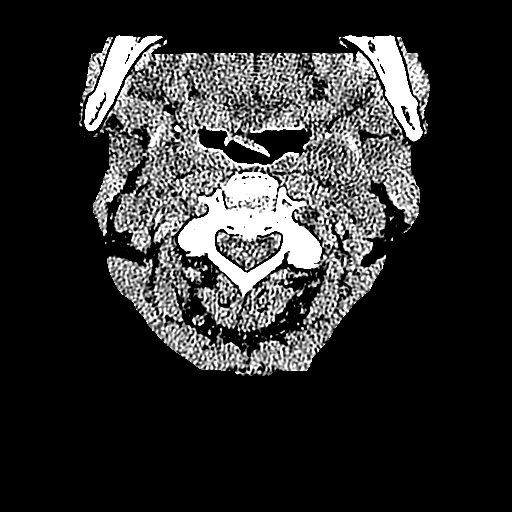
[im 67/87  brain]
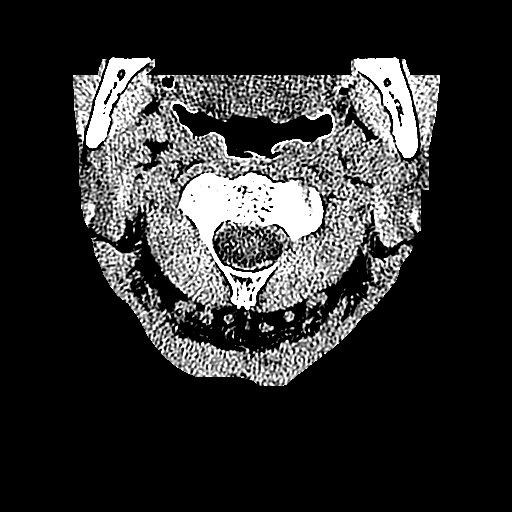
[im 77/87  brain]
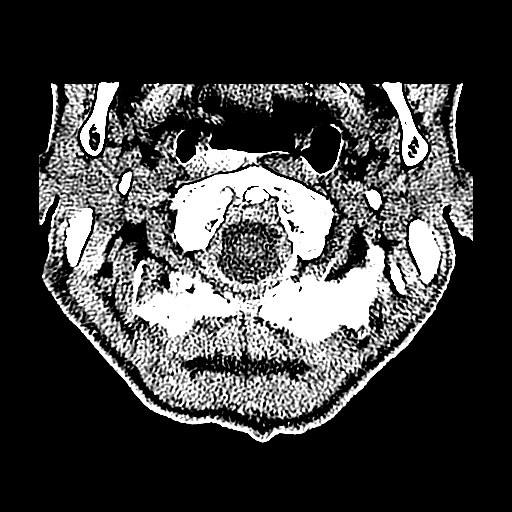

[16 of 30 positions shown; findings below may reference images not displayed]

FINDINGS: There is no evidence of acute infarction, mass lesion, or
intra- or extra-axial hemorrhage on CT.

Prominence of the ventricles and sulci reflects mild to moderate
cortical volume loss.  Diffuse periventricular and subcortical
white matter change likely reflects small vessel ischemic
microangiopathy.  Cerebellar atrophy is noted.

The brainstem and fourth ventricle are within normal limits.  The
basal ganglia are unremarkable in appearance.  The cerebral
hemispheres demonstrate grossly normal gray-white differentiation.
No mass effect or midline shift is seen.

There is no evidence of fracture; visualized osseous structures are
unremarkable in appearance.  The orbits are within normal limits.
There is partial opacification of the right side of the sphenoid
sinus.  The remaining paranasal sinuses and mastoid air cells are
well-aerated.  No significant soft tissue abnormalities are seen.
IMPRESSION: 1.  No evidence of traumatic intracranial injury or fracture.
2.  Mild to moderate cortical volume loss and diffuse small vessel
ischemic microangiopathy.
3.  Partial opacification of the right side of the sphenoid sinus.

CT CERVICAL SPINE
FINDINGS: There is no evidence of fracture or subluxation.
Vertebral bodies demonstrate normal height and alignment.  There is
mild multilevel disc space narrowing along the cervical spine, with
scattered anterior and posterior disc osteophyte complexes.  Mild
degenerative change is noted about the dens.  Prevertebral soft
tissues are within normal limits.

The visualized portions of the thyroid gland are unremarkable in
appearance.  Significant emphysema and scarring are noted at the
partially visualized lung apices.  No significant soft tissue
abnormalities are seen.
IMPRESSION: 1.  No evidence of fracture or subluxation along the cervical
spine.
2.  Mild degenerative change noted along the cervical spine.
3.  Significant emphysema and scarring at the partially visualized
lung apices.

## 2013-05-29 DEATH — deceased

## 2014-03-25 IMAGING — CR DG CHEST 2V
2 series · 2 of 2 positions shown · non-contrast
Comparison: 09/24/2012 and CT 10/05/2011

CLINICAL DATA: Fever and cough.

EXAM:
CHEST  2 VIEW

[w chest lat]
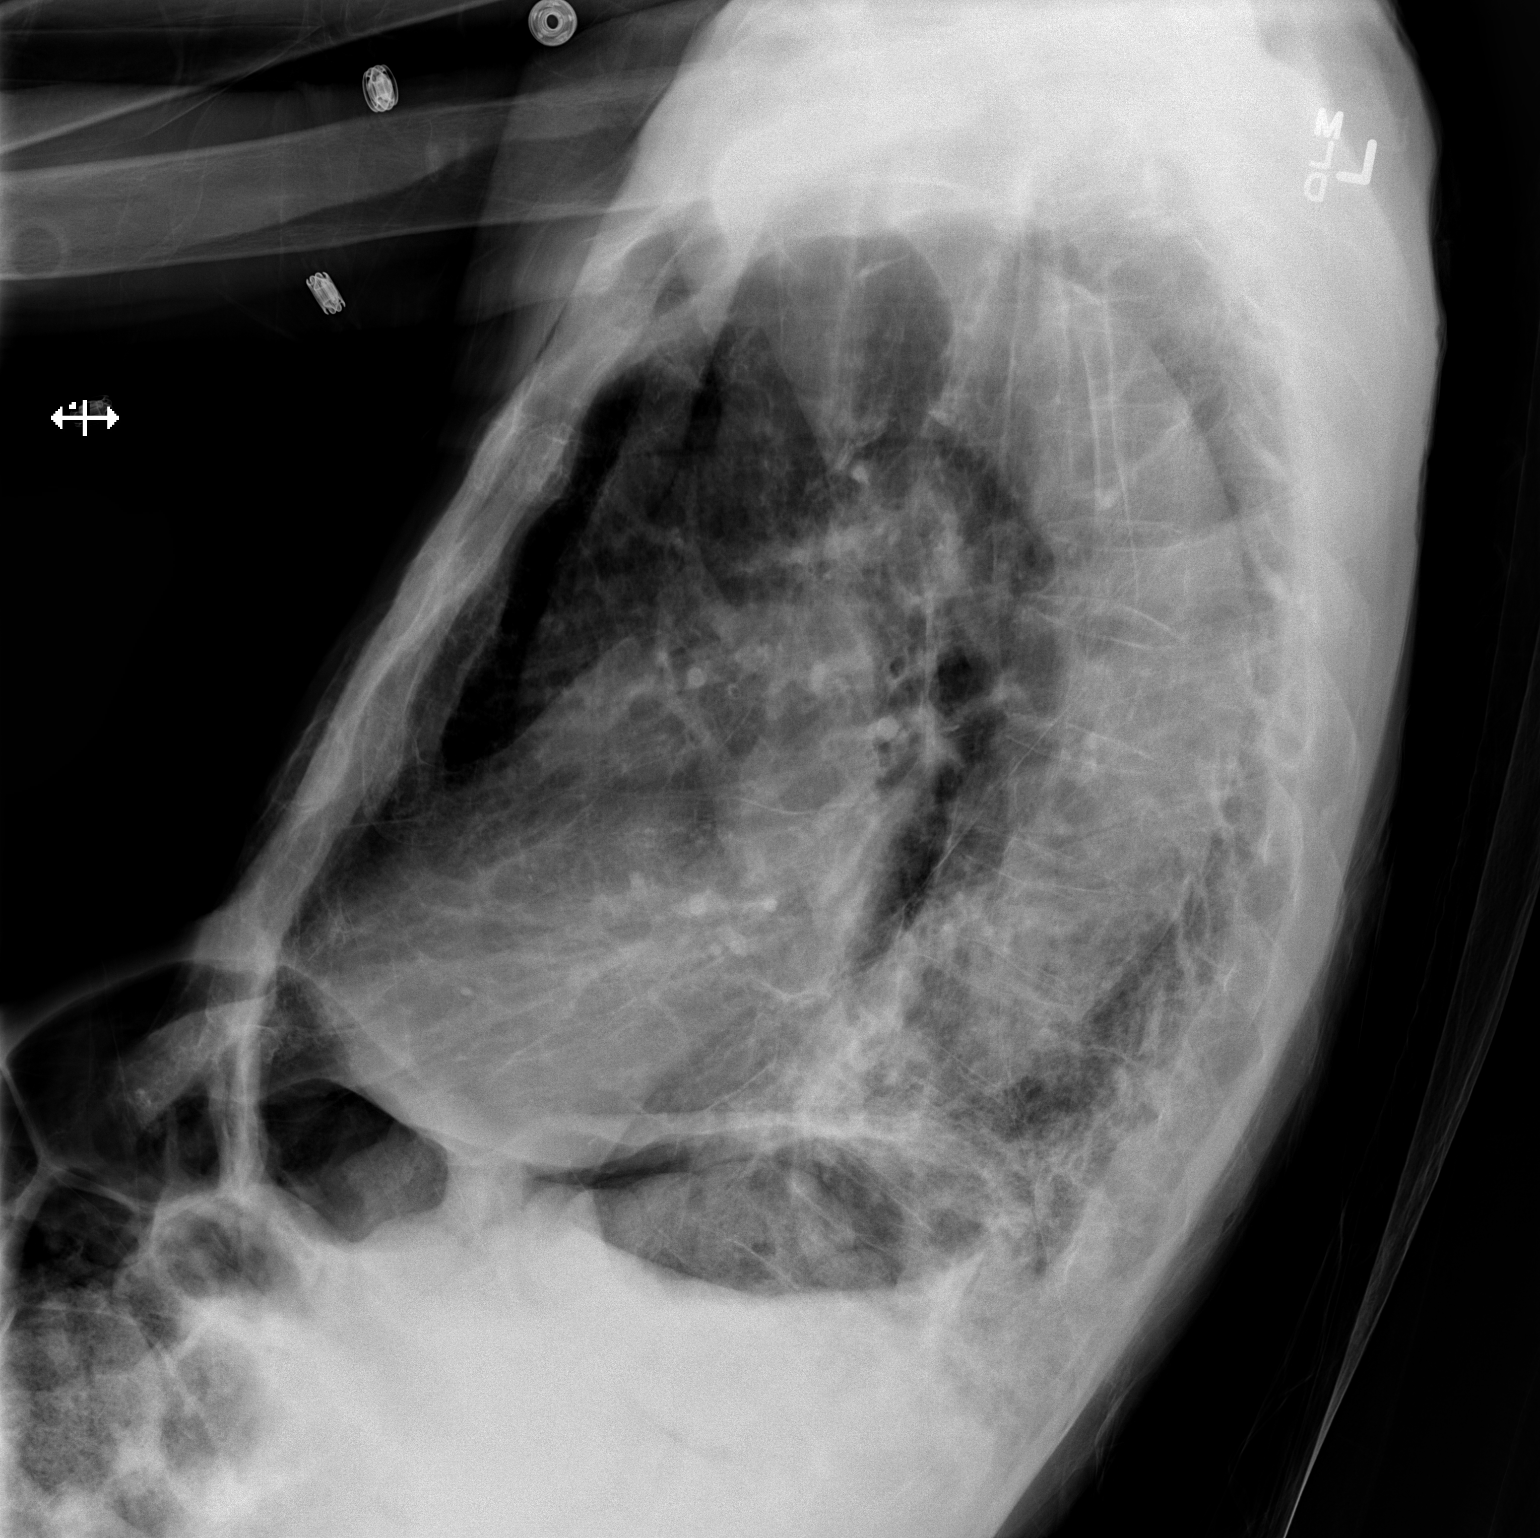

[x chest ap]
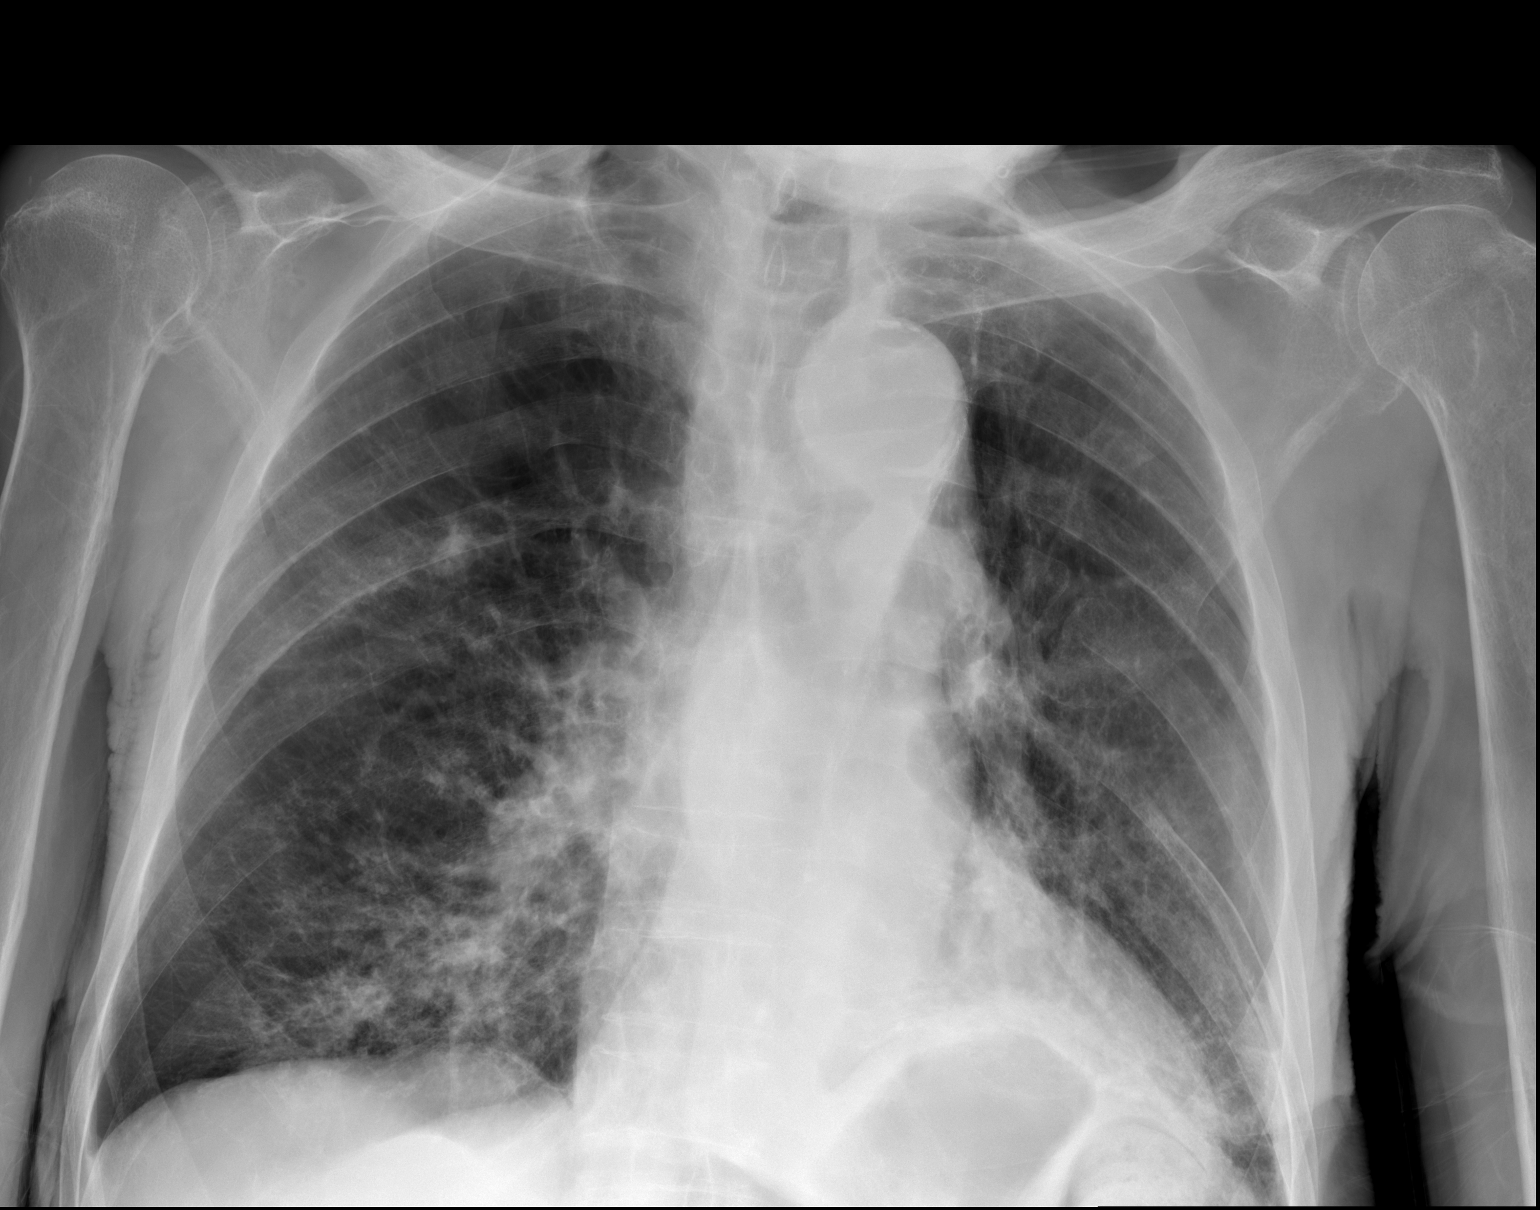

[2 of 2 positions shown; findings below may reference images not displayed]

FINDINGS: Two views of the chest demonstrate severe emphysematous changes in
the upper lungs. Slightly increased parenchymal densities at the
right lung base. Heart size is grossly stable. There is a stable
partially calcified nodule in the right mid chest that roughly
measures 1.2 cm. Again noted are compression fractures in the mid
and lower thoracic spine which have not significantly changed.
IMPRESSION: Emphysematous changes with prominent parenchymal densities at the
right lung base. The right basilar densities are slightly more
conspicuous than the previous examination and difficult to exclude
acute on chronic disease.
